# Patient Record
Sex: Female | Born: 1990 | Race: Black or African American | Hispanic: No | Marital: Single | Smoking: Never smoker
Health system: Southern US, Community
[De-identification: ages and names within clinical notes are randomized; demographics above are authoritative.]

## PROBLEM LIST (undated history)

## (undated) DIAGNOSIS — T7840XA Allergy, unspecified, initial encounter: Secondary | ICD-10-CM

## (undated) HISTORY — DX: Allergy, unspecified, initial encounter: T78.40XA

---
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
## 2010-05-14 LAB — URINALYSIS, ROUTINE W REFLEX MICROSCOPIC
Bilirubin Urine: NEGATIVE
Glucose, UA: NEGATIVE mg/dL
Ketones, ur: NEGATIVE mg/dL
Leukocytes, UA: NEGATIVE
Nitrite: NEGATIVE
Protein, ur: NEGATIVE mg/dL
Specific Gravity, Urine: 1.017 (ref 1.005–1.030)
Urobilinogen, UA: 0.2 mg/dL (ref 0.0–1.0)
pH: 5.5 (ref 5.0–8.0)

## 2010-05-14 LAB — URINE MICROSCOPIC-ADD ON

## 2011-06-22 ENCOUNTER — Emergency Department (HOSPITAL_BASED_OUTPATIENT_CLINIC_OR_DEPARTMENT_OTHER)
Admission: EM | Admit: 2011-06-22 | Discharge: 2011-06-22 | Disposition: A | Discharge status: Home / Self Care | Payer: Self-pay | Attending: Emergency Medicine | Admitting: Emergency Medicine

## 2011-06-22 ENCOUNTER — Encounter (HOSPITAL_BASED_OUTPATIENT_CLINIC_OR_DEPARTMENT_OTHER): Payer: Self-pay | Admitting: Emergency Medicine

## 2011-06-22 DIAGNOSIS — K089 Disorder of teeth and supporting structures, unspecified: Secondary | ICD-10-CM | POA: Insufficient documentation

## 2011-06-22 DIAGNOSIS — K0889 Other specified disorders of teeth and supporting structures: Secondary | ICD-10-CM

## 2011-06-22 MED ORDER — PENICILLIN V POTASSIUM 250 MG PO TABS
500.0000 mg | ORAL_TABLET | Freq: Once | ORAL | Status: AC
  Administered 2011-06-22: 500 mg via ORAL
  Filled 2011-06-22: qty 2

## 2011-06-22 MED ORDER — PENICILLIN V POTASSIUM 500 MG PO TABS: 500.0000 mg | ORAL_TABLET | Freq: Three times a day (TID) | ORAL | Status: AC

## 2011-06-22 MED ORDER — HYDROCODONE-ACETAMINOPHEN 5-325 MG PO TABS: 2.0000 | ORAL_TABLET | ORAL | Status: AC | PRN

## 2011-06-22 NOTE — ED Provider Notes (Signed)
History     CSN: [id number]  Arrival date & time 06/22/11  1006   First MD Initiated Contact with Patient 06/22/11 1020      Chief Complaint  Patient presents with  . Dental Pain    (Consider location/radiation/quality/duration/timing/severity/associated sxs/prior treatment) HPI Complains of lower left mouth pain and pain at the upper left central incisor onset approximately 24 hours ago. Pain worse with chewing treated with topical benzocaine with transient relief. Patient recently had dental work done on upper left teeth. Pain is worse with chewing not improved by anything is mild at present History reviewed. No pertinent past medical history. Medical history negative History reviewed. No pertinent past surgical history.  No family history on file.  History  Substance Use Topics  . Smoking status: Never Smoker   . Smokeless tobacco: Not on file  . Alcohol Use: No    OB History    Grav Para Term Preterm Abortions TAB SAB Ect Mult Living                  Review of Systems  Constitutional: Negative.   HENT: Negative.        Dental pain  Respiratory: Negative.   Cardiovascular: Negative.   Gastrointestinal: Negative.   Musculoskeletal: Negative.   Skin: Negative.   Neurological: Negative.   Hematological: Negative.   Psychiatric/Behavioral: Negative.   All other systems reviewed and are negative.    Allergies  Review of patient's allergies indicates no known allergies.  Home Medications  No current outpatient prescriptions on file.  BP 122/78  Pulse 94  Temp(Src) 98.6 F (37 C) (Oral)  Resp 16  Ht 5\' 8"  (1.727 m)  Wt 140 lb (63.504 kg)  BMI 21.29 kg/m2  SpO2 100%  LMP 06/21/2011  Physical Exam  Nursing note and vitals reviewed. Constitutional: She is oriented to person, place, and time. She appears well-developed and well-nourished.  HENT:  Head: Normocephalic.  Right Ear: External ear normal.  Left Ear: External ear normal.  Nose: Nose  normal.  Mouth/Throat: Oropharynx is clear and moist. No oropharyngeal exudate.       No trismus left upper incisor is tender no obvious care he no fluctuance or swelling of gingiva   Eyes: EOM are normal. Pupils are equal, round, and reactive to light.  Neck: Neck supple. No tracheal deviation present.  Cardiovascular: Normal rate.   Pulmonary/Chest: Effort normal.  Abdominal: She exhibits no distension.  Musculoskeletal: Normal range of motion.  Neurological: She is oriented to person, place, and time.  Skin: Skin is warm and dry.  Psychiatric: She has a normal mood and affect.    ED Course  Procedures (including critical care time)  Labs Reviewed - No data to display No results found.   No diagnosis found.    MDM  Plan prescription Norco. Pen-Vee K Patient encouraged keep scheduled appointment with her dentist in 2 days Diagnosis dental pain        Doug Sou, MD 06/22/11 1042

## 2011-06-22 NOTE — ED Notes (Signed)
Pt c/o dental pain (bottom LT) since yesterday- had fillings recently

## 2011-06-22 NOTE — Discharge Instructions (Signed)
Dental Pain Toothache is pain in or around a tooth. It may get worse with chewing or with cold or heat.  HOME CARE  Your dentist may use a numbing medicine during treatment. If so, you may need to avoid eating until the medicine wears off. Ask your dentist about this.   Only take medicine as told by your dentist or doctor.   Avoid chewing food near the painful tooth until after all treatment is done. Ask your dentist about this.  GET HELP RIGHT AWAY IF:   The problem gets worse or new problems appear.   You have a fever.   There is redness and puffiness (swelling) of the face, jaw, or neck.   You cannot open your mouth.   There is pain in the jaw.   There is very bad pain that is not helped by medicine.  MAKE SURE YOU:   Understand these instructions.   Will watch your condition.   Will get help right away if you are not doing well or get worse.  Document Released: 07/31/2007 Document Revised: 01/31/2011 Document Reviewed: 07/31/2007 [facility] Patient Information 2012 [place], [place].  Keep your scheduled appointment with your dentist in 2 days

## 2012-01-22 ENCOUNTER — Emergency Department (HOSPITAL_BASED_OUTPATIENT_CLINIC_OR_DEPARTMENT_OTHER)
Admission: EM | Admit: 2012-01-22 | Discharge: 2012-01-22 | Disposition: A | Discharge status: Home / Self Care | Payer: Medicaid Other | Attending: Emergency Medicine | Admitting: Emergency Medicine

## 2012-01-22 ENCOUNTER — Emergency Department (HOSPITAL_BASED_OUTPATIENT_CLINIC_OR_DEPARTMENT_OTHER): Payer: Medicaid Other

## 2012-01-22 ENCOUNTER — Encounter (HOSPITAL_BASED_OUTPATIENT_CLINIC_OR_DEPARTMENT_OTHER): Payer: Self-pay | Admitting: Emergency Medicine

## 2012-01-22 DIAGNOSIS — S60111A Contusion of right thumb with damage to nail, initial encounter: Secondary | ICD-10-CM

## 2012-01-22 DIAGNOSIS — R209 Unspecified disturbances of skin sensation: Secondary | ICD-10-CM | POA: Insufficient documentation

## 2012-01-22 DIAGNOSIS — S60019A Contusion of unspecified thumb without damage to nail, initial encounter: Secondary | ICD-10-CM

## 2012-01-22 DIAGNOSIS — M7989 Other specified soft tissue disorders: Secondary | ICD-10-CM | POA: Insufficient documentation

## 2012-01-22 DIAGNOSIS — Y9289 Other specified places as the place of occurrence of the external cause: Secondary | ICD-10-CM | POA: Insufficient documentation

## 2012-01-22 DIAGNOSIS — W230XXA Caught, crushed, jammed, or pinched between moving objects, initial encounter: Secondary | ICD-10-CM | POA: Insufficient documentation

## 2012-01-22 DIAGNOSIS — Y9389 Activity, other specified: Secondary | ICD-10-CM | POA: Insufficient documentation

## 2012-01-22 DIAGNOSIS — S6000XA Contusion of unspecified finger without damage to nail, initial encounter: Secondary | ICD-10-CM | POA: Insufficient documentation

## 2012-01-22 MED ORDER — HYDROCODONE-ACETAMINOPHEN 5-500 MG PO TABS: 1.0000 | ORAL_TABLET | Freq: Four times a day (QID) | ORAL | Status: DC | PRN

## 2012-01-22 NOTE — ED Notes (Signed)
Pt c/o Rt thumb pain r/t shutting thumb in car door x 5 days ago.

## 2012-01-22 NOTE — ED Provider Notes (Signed)
History     CSN: [id number]  Arrival date & time 01/22/12  6213   First MD Initiated Contact with Patient 01/22/12 [phone]      Chief Complaint  Patient presents with  . Hand Pain    Rt thumb    (Consider location/radiation/quality/duration/timing/severity/associated sxs/prior treatment) HPI Comments: Patient complains of constant throbbing pain to her right thumb. She states that 5 days ago she shut it in a car door and it's been throbbing since then. She denies any other hand pain. She complains of some numbness to the tip of her finger. Denies any other injuries. She's taking over-the-counter medicines without relief.  Patient is a 21 y.o. female presenting with hand pain.  Hand Pain    History reviewed. No pertinent past medical history.  History reviewed. No pertinent past surgical history.  No family history on file.  History  Substance Use Topics  . Smoking status: Never Smoker   . Smokeless tobacco: Not on file  . Alcohol Use: No    OB History    Grav Para Term Preterm Abortions TAB SAB Ect Mult Living                  Review of Systems  Constitutional: Negative for fever.  Gastrointestinal: Negative for nausea and vomiting.  Musculoskeletal: Positive for joint swelling (to right thumb) and arthralgias.  Skin: Negative for wound.  Neurological: Positive for numbness. Negative for weakness.    Allergies  Review of patient's allergies indicates no known allergies.  Home Medications   Current Outpatient Rx  Name  Route  Sig  Dispense  Refill  . HYDROCODONE-ACETAMINOPHEN 5-500 MG PO TABS   Oral   Take 1-2 tablets by mouth every 6 (six) hours as needed for pain.   15 tablet   0     BP 122/73  Pulse 85  Temp 98.3 F (36.8 C) (Oral)  Resp 16  SpO2 100%  LMP 01/13/2012  Physical Exam  Constitutional: She appears well-developed and well-nourished.  HENT:  Head: Normocephalic and atraumatic.  Cardiovascular: Normal rate.   Pulmonary/Chest:  Effort normal and breath sounds normal.  Musculoskeletal:       Patient has mild swelling over the IP joint and the distal phalanx of the right thumb. She appears to have a subungual hematoma to the right, however she has gel nails in place slight take completely evaluate and nail. The nail does appear intact with no lacerations. She has pain on palpation over the IP joint in the distal phalanx as well as pain on range of motion of the distal phalanx. She has some subjective numbness to light touch distally, capillary refill is less than 2. She is able to flex and extend the joint however is limited due to pain. There's no other pain to the hand.    ED Course  Procedures (including critical care time)   Dg Finger Thumb Right  01/22/2012  *RADIOLOGY REPORT*  Clinical Data: Right thumb injury  RIGHT THUMB 2+V  Comparison: None.  Findings: Three views of the right thumb submitted.  No acute fracture or subluxation.  No radiopaque foreign body.  IMPRESSION: No acute fracture or subluxation.   Original Report Authenticated By: Natasha Mead, M.D.       1. Thumb contusion   2. Subungual hematoma of right thumb       MDM  No fractures identified. Patient does appear to have a subungual hematoma however cannot be fully evaluated due to the gel nails  that she has been placed. However she did not have significant pain over that and nail bed. Majority of her pain is over the IP joint. Patient placed in a finger splint advised ice and elevation and was given prescription for Vicodin for pain I did advise her that is a possibility that her nail may fall off. Advise her to followup with her primary care physician return here as needed for any worsening symptoms.        Rolan Bucco, MD 01/22/12 [phone]

## 2012-01-22 NOTE — ED Notes (Signed)
Patient ambulatory to radiology.

## 2014-01-08 IMAGING — CR DG FINGER THUMB 2+V*R*
3 series · 3 of 3 positions shown · non-contrast
Comparison: None.

CLINICAL DATA: Right thumb injury

RIGHT THUMB 2+V

[x finger pa right]
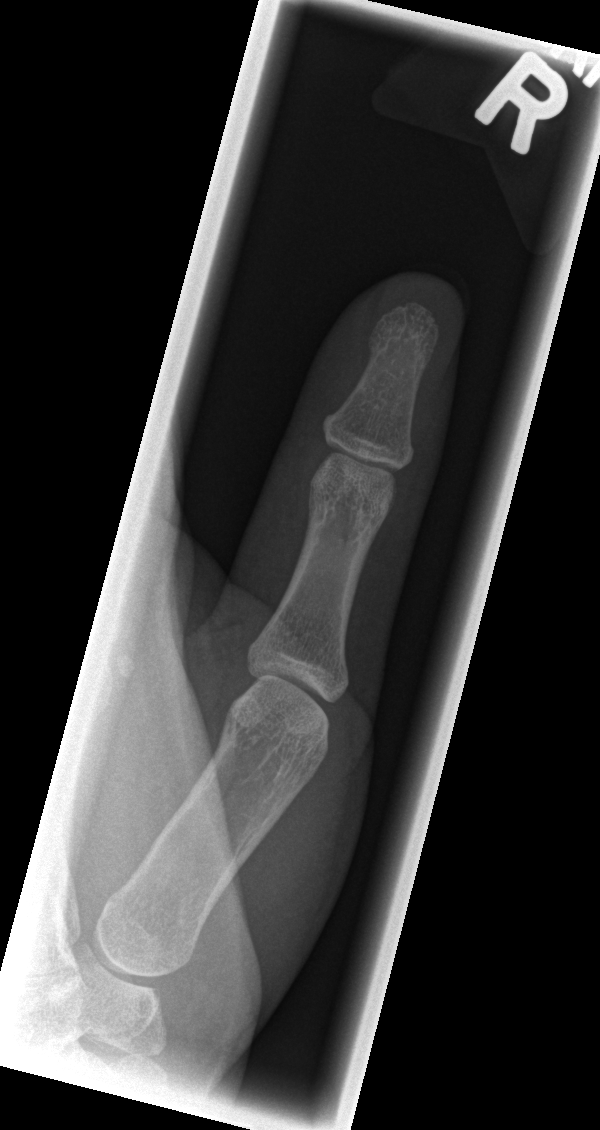

[x finger obl. right]
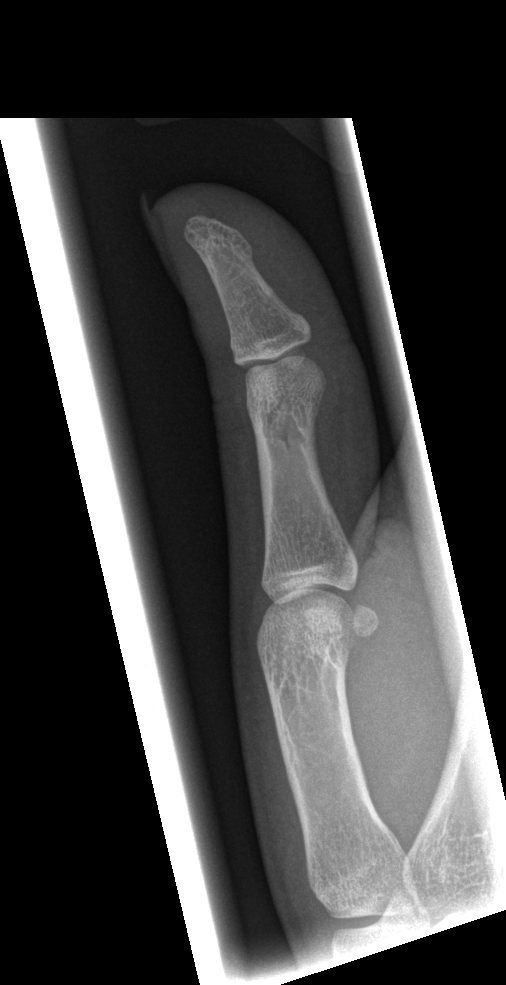

[x finger lateral right]
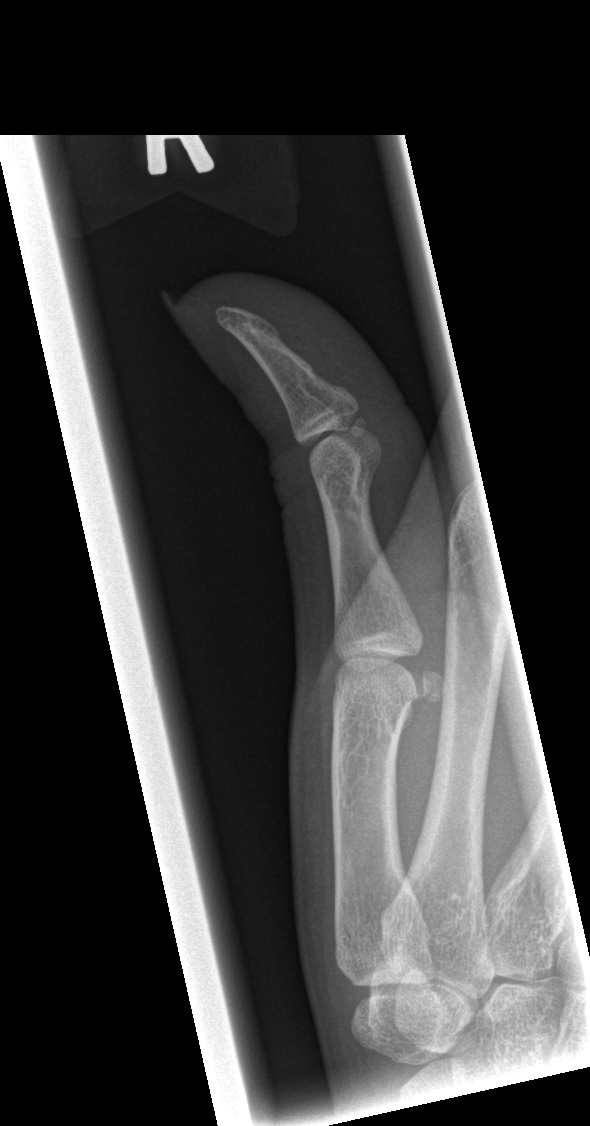

[3 of 3 positions shown; findings below may reference images not displayed]

FINDINGS: Three views of the right thumb submitted.  No acute
fracture or subluxation.  No radiopaque foreign body.
IMPRESSION: No acute fracture or subluxation.

## 2014-05-29 ENCOUNTER — Emergency Department (HOSPITAL_BASED_OUTPATIENT_CLINIC_OR_DEPARTMENT_OTHER)
Admission: EM | Admit: 2014-05-29 | Discharge: 2014-05-29 | Disposition: A | Discharge status: Home / Self Care | Payer: Medicaid Other | Attending: Emergency Medicine | Admitting: Emergency Medicine

## 2014-05-29 ENCOUNTER — Encounter (HOSPITAL_BASED_OUTPATIENT_CLINIC_OR_DEPARTMENT_OTHER): Payer: Self-pay | Admitting: *Deleted

## 2014-05-29 DIAGNOSIS — Y9389 Activity, other specified: Secondary | ICD-10-CM | POA: Insufficient documentation

## 2014-05-29 DIAGNOSIS — Z23 Encounter for immunization: Secondary | ICD-10-CM | POA: Insufficient documentation

## 2014-05-29 DIAGNOSIS — W25XXXA Contact with sharp glass, initial encounter: Secondary | ICD-10-CM | POA: Insufficient documentation

## 2014-05-29 DIAGNOSIS — Y9289 Other specified places as the place of occurrence of the external cause: Secondary | ICD-10-CM | POA: Insufficient documentation

## 2014-05-29 DIAGNOSIS — S31010A Laceration without foreign body of lower back and pelvis without penetration into retroperitoneum, initial encounter: Secondary | ICD-10-CM | POA: Insufficient documentation

## 2014-05-29 DIAGNOSIS — Y998 Other external cause status: Secondary | ICD-10-CM | POA: Insufficient documentation

## 2014-05-29 DIAGNOSIS — IMO0002 Reserved for concepts with insufficient information to code with codable children: Secondary | ICD-10-CM

## 2014-05-29 MED ORDER — TETANUS-DIPHTH-ACELL PERTUSSIS 5-2.5-18.5 LF-MCG/0.5 IM SUSP
INTRAMUSCULAR | Status: AC
  Administered 2014-05-29: 0.5 mL via INTRAMUSCULAR
  Filled 2014-05-29: qty 0.5

## 2014-05-29 MED ORDER — HYDROCODONE-ACETAMINOPHEN 5-325 MG PO TABS: 1.0000 | ORAL_TABLET | Freq: Four times a day (QID) | ORAL | Status: DC | PRN

## 2014-05-29 MED ORDER — TETANUS-DIPHTH-ACELL PERTUSSIS 5-2.5-18.5 LF-MCG/0.5 IM SUSP
0.5000 mL | Freq: Once | INTRAMUSCULAR | Status: AC
  Administered 2014-05-29: 0.5 mL via INTRAMUSCULAR

## 2014-05-29 MED ORDER — LIDOCAINE-EPINEPHRINE 2 %-1:100000 IJ SOLN
INTRAMUSCULAR | Status: AC
  Administered 2014-05-29: 02:00:00
  Filled 2014-05-29: qty 1

## 2014-05-29 NOTE — ED Notes (Addendum)
[facility] Pt tolerating suturing. NAD, calm, no changes.

## 2014-05-29 NOTE — Discharge Instructions (Signed)

## 2014-05-29 NOTE — ED Notes (Addendum)
Pt states that she was sitting on a glass table about 30 minutes ago when the glass gave away causing her to fall into the glass table. Pt has a horseshoe shaped laceration noted to right lower back. Bleeding controlled at present. Wound cleaned by EMT on arrival. Denies any other injury.

## 2014-05-29 NOTE — ED Provider Notes (Signed)
CSN: [id number]     Arrival date & time 05/29/14  0156 History   First MD Initiated Contact with Patient 05/29/14 0200     Chief Complaint  Patient presents with  . Body Laceration     (Consider location/radiation/quality/duration/timing/severity/associated sxs/prior Treatment) HPI  This is a 24 year old female who presents with a laceration to the right flank and back. Patient reports that she was sitting on a glass table when it broke and she fell. A piece of glass cut her. She denies any other injury. She denies hitting her head or losing consciousness. Last tetanus was when she was 65 or 24 years old. This happened approximately 30 minutes prior to arrival.  History reviewed. No pertinent past medical history. History reviewed. No pertinent past surgical history. No family history on file. History  Substance Use Topics  . Smoking status: Never Smoker   . Smokeless tobacco: Not on file  . Alcohol Use: No   OB History    No data available     Review of Systems  Skin: Positive for wound. Negative for color change.  All other systems reviewed and are negative.     Allergies  Review of patient's allergies indicates no known allergies.  Home Medications   Prior to Admission medications   Medication Sig Start Date End Date Taking? Authorizing Provider  HYDROcodone-acetaminophen (NORCO/VICODIN) 5-325 MG per tablet Take 1 tablet by mouth every 6 (six) hours as needed. 05/29/14   Shon Baton, MD   LMP 05/23/2014 (Approximate) Physical Exam  Constitutional: She is oriented to person, place, and time. She appears well-developed and well-nourished. No distress.  HENT:  Head: Normocephalic and atraumatic.  Cardiovascular: Normal rate, regular rhythm and normal heart sounds.   No murmur heard. Pulmonary/Chest: Effort normal and breath sounds normal. No respiratory distress. She has no wheezes.  Neurological: She is alert and oriented to person, place, and time.  Skin: Skin  is warm and dry.  4 cm laceration, U-shaped over the right lower back, superficial but gaping, bleeding controlled, no foreign bodies palpated  Psychiatric: She has a normal mood and affect.  Nursing note and vitals reviewed.   ED Course  Procedures (including critical care time)  LACERATION REPAIR Performed by: Shon Baton Authorized by: Shon Baton Consent: Verbal consent obtained. Risks and benefits: risks, benefits and alternatives were discussed Consent given by: patient Patient identity confirmed: provided demographic data Prepped and Draped in normal sterile fashion Wound explored  Laceration Location:back  Laceration Length: 4cm  No Foreign Bodies seen or palpated  Anesthesia: local infiltration  Local anesthetic: lidocaine 1% w epinephrine  Anesthetic total: 3 ml  Irrigation method: syringe Amount of cleaning: standard  Skin closure: 3-0 nylon  Number of sutures: 7  Technique: interrupted and mattress suture  Patient tolerance: Patient tolerated the procedure well with no immediate complications.  Labs Review Labs Reviewed - No data to display  Imaging Review No results found.   EKG Interpretation None      MDM   Final diagnoses:  Laceration    Patient presents with laceration to the right back. Denies any other injury. No other notable injury on exam. Laceration is superficial but gaping over the right back. Tetanus updated. Wound closed as above.  Patient tolerated the procedure well. Discussed with patient wound care. Patient to follow-up in 10 days for suture removal.  After history, exam, and medical workup I feel the patient has been appropriately medically screened and is safe for discharge  home. Pertinent diagnoses were discussed with the patient. Patient was given return precautions.    Shon Batonourtney F Horton, MD 05/29/14 [phone]

## 2014-05-29 NOTE — ED Notes (Signed)
[facility]. Preparing local and suturing. Pt alert, NAD, calm, interactive, resps e/u, speaking in clear complete sentences, no dyspnea noted, bleeding controlled. Sitting upright on edge of bed.

## 2014-06-08 ENCOUNTER — Emergency Department (HOSPITAL_BASED_OUTPATIENT_CLINIC_OR_DEPARTMENT_OTHER)
Admission: EM | Admit: 2014-06-08 | Discharge: 2014-06-08 | Disposition: A | Discharge status: Home / Self Care | Payer: Medicaid Other | Attending: Emergency Medicine | Admitting: Emergency Medicine

## 2014-06-08 ENCOUNTER — Encounter (HOSPITAL_BASED_OUTPATIENT_CLINIC_OR_DEPARTMENT_OTHER): Payer: Self-pay

## 2014-06-08 DIAGNOSIS — Z4802 Encounter for removal of sutures: Secondary | ICD-10-CM | POA: Insufficient documentation

## 2014-06-08 NOTE — Discharge Instructions (Signed)
Return to the emergency room with worsening of symptoms, new symptoms or with symptoms that are concerning, especially fevers, redness, swelling, pus.  Suture Removal, Care After Refer to this sheet in the next few weeks. These instructions provide you with information on caring for yourself after your procedure. Your health care provider may also give you more specific instructions. Your treatment has been planned according to current medical practices, but problems sometimes occur. Call your health care provider if you have any problems or questions after your procedure. WHAT TO EXPECT AFTER THE PROCEDURE After your stitches (sutures) are removed, it is typical to have the following:  Some discomfort and swelling in the wound area.  Slight redness in the area. HOME CARE INSTRUCTIONS   If you have skin adhesive strips over the wound area, do not take the strips off. They will fall off on their own in a few days. If the strips remain in place after 14 days, you may remove them.  Change any bandages (dressings) at least once a day or as directed by your health care provider. If the bandage sticks, soak it off with warm, soapy water.  Apply cream or ointment only as directed by your health care provider. If using cream or ointment, wash the area with soap and water 2 times a day to remove all the cream or ointment. Rinse off the soap and pat the area dry with a clean towel.  Keep the wound area dry and clean. If the bandage becomes wet or dirty, or if it develops a bad smell, change it as soon as possible.  Continue to protect the wound from injury.  Use sunscreen when out in the sun. New scars become sunburned easily. SEEK MEDICAL CARE IF:  You have increasing redness, swelling, or pain in the wound.  You see pus coming from the wound.  You have a fever.  You notice a bad smell coming from the wound or dressing.  Your wound breaks open (edges not staying together). Document Released:  11/06/2000 Document Revised: 12/02/2012 Document Reviewed: 09/23/2012 [facility] Patient Information 2015 [place], [place]. This information is not intended to replace advice given to you by your health care provider. Make sure you discuss any questions you have with your health care provider.  Scar Minimization You will have a scar anytime you have surgery and a cut is made in the skin or you have something removed from your skin (mole, skin cancer, cyst). Although scars are unavoidable following surgery, there are ways to minimize their appearance. It is important to follow all the instructions you receive from your caregiver about wound care. How your wound heals will influence the appearance of your scar. If you do not follow the wound care instructions as directed, complications such as infection may occur. Wound instructions include keeping the wound clean, moist, and not letting the wound form a scab. Some people form scars that are raised and lumpy (hypertrophic) or larger than the initial wound (keloidal). HOME CARE INSTRUCTIONS   Follow wound care instructions as directed.  Keep the wound clean by washing it with soap and water.  Keep the wound moist with provided antibiotic cream or petroleum jelly until completely healed. Moisten twice a day for about 2 weeks.  Get stitches (sutures) taken out at the scheduled time.  Avoid touching or manipulating your wound unless needed. Wash your hands thoroughly before and after touching your wound.  Follow all restrictions such as limits on exercise or work. This depends on  where your scar is located.  Keep the scar protected from sunburn. Cover the scar with sunscreen/sunblock with SPF 30 or higher.  Gently massage the scar using a circular motion to help minimize the appearance of the scar. Do this only after the wound has closed and all the sutures have been removed.  For hypertrophic or keloidal scars, there are several ways to treat and  minimize their appearance. Methods include compression therapy, intralesional corticosteroids, laser therapy, or surgery. These methods are performed by your caregiver. Remember that the scar may appear lighter or darker than your normal skin color. This difference in color should even out with time. SEEK MEDICAL CARE IF:   You have a fever.  You develop signs of infection such as pain, redness, pus, and warmth.  You have questions or concerns. Document Released: 08/01/2009 Document Revised: 05/06/2011 Document Reviewed: 08/01/2009 [facility] Patient Information 2015 [place]. This information is not intended to replace advice given to you by your health care provider. Make sure you discuss any questions you have with your health care provider.

## 2014-06-08 NOTE — ED Provider Notes (Signed)
CSN: [id number]     Arrival date & time 06/08/14  1329 History   First MD Initiated Contact with Patient 06/08/14 1346     Chief Complaint  Patient presents with  . Suture / Staple Removal     (Consider location/radiation/quality/duration/timing/severity/associated sxs/prior Treatment) HPI  Ulice BrilliantFatima Nadel is a 24 y.o. female presenting with suture removal. Patient with laceration to right flank 10 days ago. Patient denies any fevers, chills, nausea, vomiting. No pus.   History reviewed. No pertinent past medical history. No past surgical history on file. No family history on file. History  Substance Use Topics  . Smoking status: Never Smoker   . Smokeless tobacco: Not on file  . Alcohol Use: No   OB History    No data available     Review of Systems  Constitutional: Negative for fever and chills.  Gastrointestinal: Negative for nausea and vomiting.  Skin: Positive for wound. Negative for color change.      Allergies  Review of patient's allergies indicates no known allergies.  Home Medications   Prior to Admission medications   Medication Sig Start Date End Date Taking? Authorizing Provider  HYDROcodone-acetaminophen (NORCO/VICODIN) 5-325 MG per tablet Take 1 tablet by mouth every 6 (six) hours as needed. 05/29/14   Shon Batonourtney F Horton, MD   BP 115/75 mmHg  Pulse 94  Temp(Src) 98.8 F (37.1 C) (Oral)  Resp 16  Ht 5\' 8"  (1.727 m)  Wt 160 lb (72.576 kg)  BMI 24.33 kg/m2  SpO2 100%  LMP 05/23/2014 (Approximate) Physical Exam  Constitutional: She appears well-developed and well-nourished. No distress.  HENT:  Head: Normocephalic and atraumatic.  Eyes: Conjunctivae are normal. Right eye exhibits no discharge. Left eye exhibits no discharge.  Pulmonary/Chest: Effort normal. No respiratory distress.  Neurological: She is alert. Coordination normal.  Skin: She is not diaphoretic.  Clean dry intact laceration with 7 sutures U shape in right lower flank.  Psychiatric:  She has a normal mood and affect. Her behavior is normal.  Nursing note and vitals reviewed.   ED Course  Procedures (including critical care time) Labs Review Labs Reviewed - No data to display  Imaging Review No results found.   EKG Interpretation None      SUTURE REMOVAL Performed by: Oswaldo ConroyREECH, Abreanna Drawdy  Consent: Verbal consent obtained. Patient identity confirmed: provided demographic data Time out: Immediately prior to procedure a "time out" was called to verify the correct patient, procedure, equipment, support staff and site/side marked as required.  Location details: left lower back  Wound Appearance: clean  Sutures/Staples Removed: 7  Facility: sutures placed in this facility Patient tolerance: Patient tolerated the procedure well with no immediate complications.     MDM   Final diagnoses:  Visit for suture removal    Pt to ER for staple/suture removal and wound check. Procedure tolerated well. Vitals normal, no signs of infection. Scar minimization & return precautions given at dc.    Oswaldo ConroyVictoria Zoie Sarin, PA-C 06/08/14 1418  Glynn OctaveStephen Rancour, MD 06/08/14 [phone]

## 2014-06-08 NOTE — ED Notes (Signed)
For suture removal

## 2016-12-05 ENCOUNTER — Emergency Department (HOSPITAL_BASED_OUTPATIENT_CLINIC_OR_DEPARTMENT_OTHER): Payer: Self-pay

## 2016-12-05 ENCOUNTER — Encounter (HOSPITAL_BASED_OUTPATIENT_CLINIC_OR_DEPARTMENT_OTHER): Payer: Self-pay

## 2016-12-05 ENCOUNTER — Emergency Department (HOSPITAL_BASED_OUTPATIENT_CLINIC_OR_DEPARTMENT_OTHER)
Admission: EM | Admit: 2016-12-05 | Discharge: 2016-12-05 | Disposition: A | Discharge status: Home / Self Care | Payer: Self-pay | Attending: Emergency Medicine | Admitting: Emergency Medicine

## 2016-12-05 DIAGNOSIS — R05 Cough: Secondary | ICD-10-CM | POA: Insufficient documentation

## 2016-12-05 DIAGNOSIS — T7840XA Allergy, unspecified, initial encounter: Secondary | ICD-10-CM | POA: Insufficient documentation

## 2016-12-05 DIAGNOSIS — R059 Cough, unspecified: Secondary | ICD-10-CM

## 2016-12-05 MED ORDER — PREDNISONE 50 MG PO TABS
60.0000 mg | ORAL_TABLET | Freq: Once | ORAL | Status: AC
  Administered 2016-12-05: 60 mg via ORAL
  Filled 2016-12-05: qty 1

## 2016-12-05 MED ORDER — LORATADINE 10 MG PO TABS
10.0000 mg | ORAL_TABLET | Freq: Once | ORAL | Status: AC
  Administered 2016-12-05: 10 mg via ORAL
  Filled 2016-12-05: qty 1

## 2016-12-05 MED ORDER — ALBUTEROL SULFATE HFA 108 (90 BASE) MCG/ACT IN AERS
2.0000 | INHALATION_SPRAY | Freq: Once | RESPIRATORY_TRACT | Status: AC
  Administered 2016-12-05: 2 via RESPIRATORY_TRACT

## 2016-12-05 MED ORDER — AEROCHAMBER PLUS FLO-VU MEDIUM MISC
1.0000 | Freq: Once | Status: AC
  Administered 2016-12-05: 1
  Filled 2016-12-05: qty 1

## 2016-12-05 MED ORDER — PREDNISONE 20 MG PO TABS: ORAL_TABLET | ORAL | 0 refills | Status: DC

## 2016-12-05 MED ORDER — LORATADINE 10 MG PO TABS: 10.0000 mg | ORAL_TABLET | Freq: Every day | ORAL | 0 refills | Status: DC

## 2016-12-05 NOTE — ED Notes (Signed)
ED Provider at bedside. 

## 2016-12-05 NOTE — ED Provider Notes (Signed)
[facility]   CSN: [id number] Arrival date & time: 12/05/16  0204     History   Chief Complaint Chief Complaint  Patient presents with  . Cough    HPI Julia Bridges is a 26 y.o. female.  The history is provided by the patient.  Cough  This is a new problem. The current episode started more than 2 days ago. The problem occurs every few minutes. The problem has not changed since onset.The cough is non-productive. There has been no fever. Pertinent negatives include no chest pain, no chills, no sweats, no weight loss, no ear congestion, no ear pain, no rhinorrhea, no sore throat, no shortness of breath, no wheezing and no eye redness. Treatments tried: robitussin. The treatment provided no relief. She is not a smoker. Her past medical history does not include pneumonia, bronchiectasis, COPD, emphysema or asthma.    History reviewed. No pertinent past medical history.  There are no active problems to display for this patient.   History reviewed. No pertinent surgical history.  OB History    No data available       Home Medications    Prior to Admission medications   Medication Sig Start Date End Date Taking? Authorizing Provider  HYDROcodone-acetaminophen (NORCO/VICODIN) 5-325 MG per tablet Take 1 tablet by mouth every 6 (six) hours as needed. 05/29/14  Yes Horton, Mayer Masker, MD    Family History No family history on file.  Social History Social History  Substance Use Topics  . Smoking status: Never Smoker  . Smokeless tobacco: Never Used  . Alcohol use No     Allergies   Patient has no known allergies.   Review of Systems Review of Systems  Constitutional: Negative for chills, fever and weight loss.  HENT: Negative for ear pain, rhinorrhea and sore throat.   Eyes: Negative for redness.  Respiratory: Positive for cough. Negative for shortness of breath and wheezing.   Cardiovascular: Negative for chest pain.  All other systems  reviewed and are negative.    Physical Exam Updated Vital Signs BP (!) 134/94 (BP Location: Right Arm)   Pulse (!) 103   Temp 98.6 F (37 C) (Oral)   Resp 20   Ht  (1.727 m)   Wt 77.1 kg (170 lb)   LMP 11/20/2016   SpO2 100%   BMI 25.85 kg/m   Physical Exam  Constitutional: She is oriented to person, place, and time. She appears well-developed and well-nourished. No distress.  HENT:  Head: Normocephalic and atraumatic.  Mouth/Throat: No oropharyngeal exudate.  Eyes: Pupils are equal, round, and reactive to light. Conjunctivae are normal.  Neck: Normal range of motion. Neck supple.  Cardiovascular: Normal rate, regular rhythm, normal heart sounds and intact distal pulses.   Pulmonary/Chest: Effort normal and breath sounds normal. No stridor. No respiratory distress. She has no wheezes. She has no rales. She exhibits no tenderness.  Abdominal: Soft. Bowel sounds are normal. She exhibits no mass. There is no tenderness. There is no rebound and no guarding.  Musculoskeletal: Normal range of motion. She exhibits no tenderness.  Negative Homan's sign  Neurological: She is alert and oriented to person, place, and time. She displays normal reflexes.  Skin: Skin is warm and dry. Capillary refill takes less than 2 seconds.  Psychiatric: She has a normal mood and affect.     ED Treatments / Results   Vitals:   12/05/16 0227 12/05/16 0251  BP:    Pulse:  Resp:    Temp:    SpO2: 100% 100%    Radiology Dg Chest 2 View  Result Date: 12/05/2016 CLINICAL DATA:  Nonproductive cough for 1 week. EXAM: CHEST  2 VIEW COMPARISON:  06/08/2004 FINDINGS: The lungs are clear. The pulmonary vasculature is normal. Heart size is normal. Hilar and mediastinal contours are unremarkable. There is no pleural effusion. IMPRESSION: No active cardiopulmonary disease. Electronically Signed   By: Ellery Plunk M.D.   On: 12/05/2016 03:39    Procedures Procedures (including critical care  time)  Medications Ordered in ED Medications  predniSONE (DELTASONE) tablet 60 mg (60 mg Oral Given 12/05/16 0245)  albuterol (PROVENTIL HFA;VENTOLIN HFA) 108 (90 Base) MCG/ACT inhaler 2 puff (2 puffs Inhalation Given 12/05/16 0247)  AEROCHAMBER PLUS FLO-VU MEDIUM MISC 1 each (1 each Other Given 12/05/16 0247)  loratadine (CLARITIN) tablet 10 mg (10 mg Oral Given 12/05/16 0245)     PERC negative wells 0 highly doubt PE in this low risk patient.  No leg pain no surgery no travel no OCP  Final Clinical Impressions(s) / ED Diagnoses  Likely allergic in nature will treat for same.  Strict return precautions.Strict return precautions given for  Shortness of breath, swelling or the lips or tongue, chest pain, dyspnea on exertion, new weakness or numbness changes in vision or speech,  Inability to tolerate liquids or food, changes in voice cough, altered mental status or any concerns. No signs of systemic illness or infection. The patient is nontoxic-appearing on exam and vital signs are within normal limits.    I have reviewed the triage vital signs and the nursing notes. Pertinent labs &imaging results that were available during my care of the patient were reviewed by me and considered in my medical decision making (see chart for details).  After history, exam, and medical workup I feel the patient has been appropriately medically screened and is safe for discharge home. Pertinent diagnoses were discussed with the patient. Patient was given return precautions.    Rusty Glodowski, MD 12/05/16 [phone]

## 2016-12-05 NOTE — ED Triage Notes (Signed)
Pt c/o dry cough x 1 week with SOB. Has taken OTC robitussin and dayquill with no relief. Pt denies fevers.

## 2016-12-05 NOTE — ED Notes (Signed)
Patient transported to X-ray 

## 2018-11-22 IMAGING — DX DG CHEST 2V
3 series · 3 of 3 positions shown · non-contrast
Comparison: 06/08/2004

CLINICAL DATA: Nonproductive cough for 1 week.

EXAM:
CHEST  2 VIEW

[chest pa]
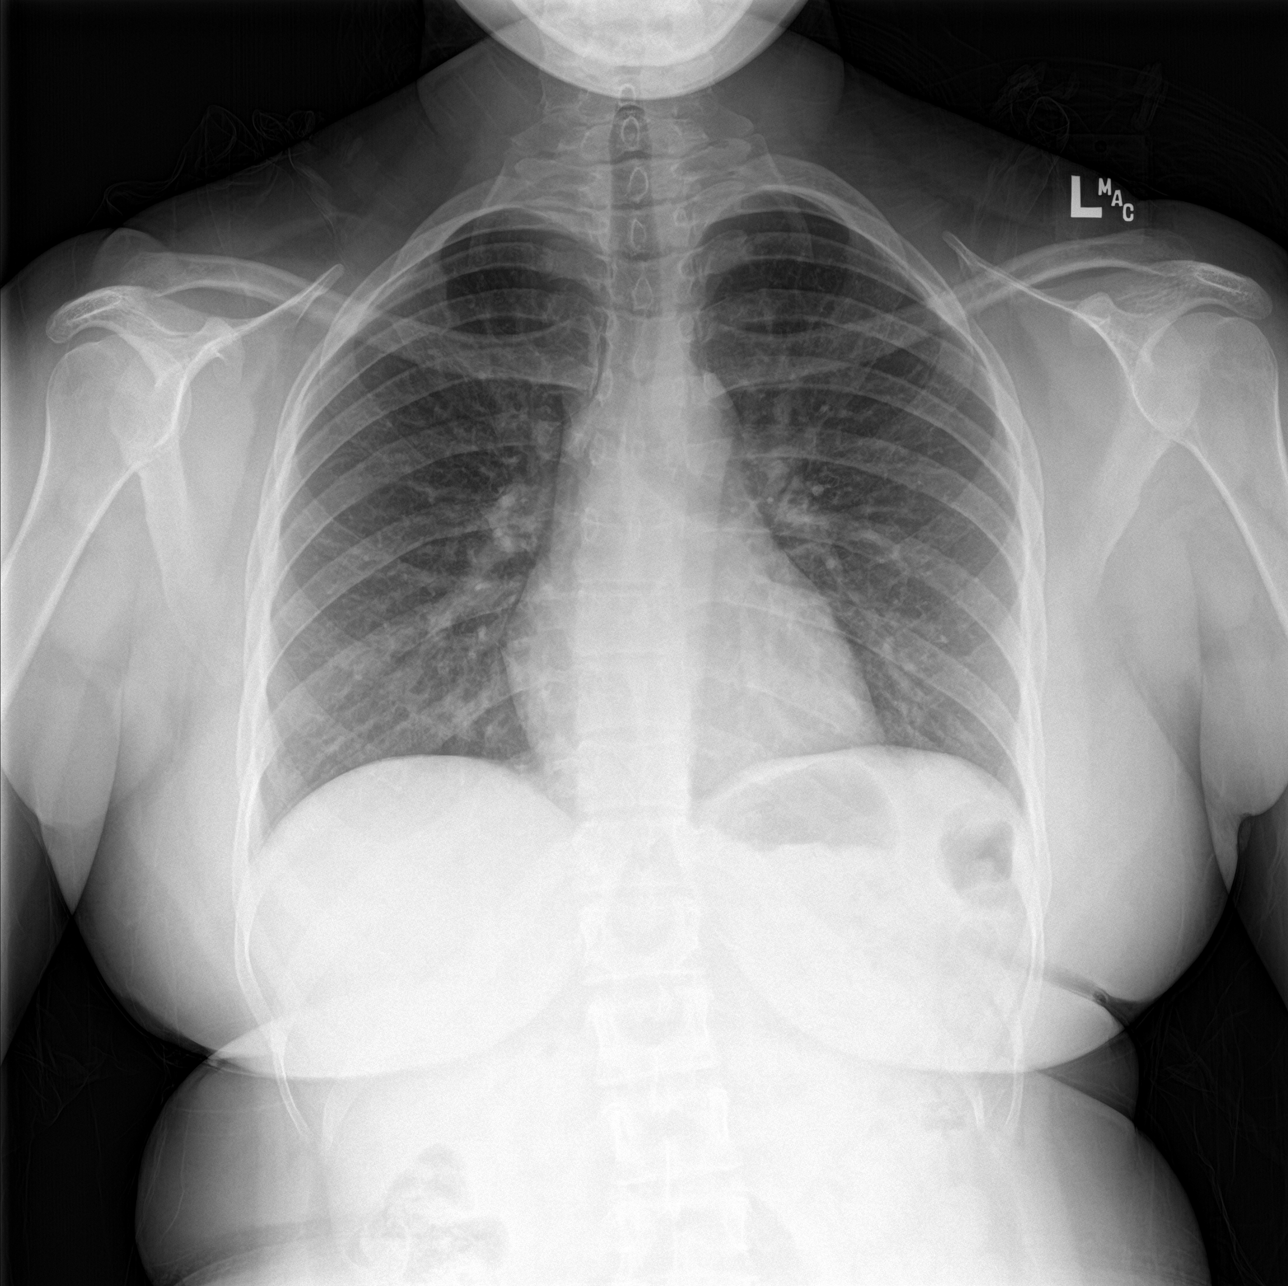

[chest lat (1 of 2)]
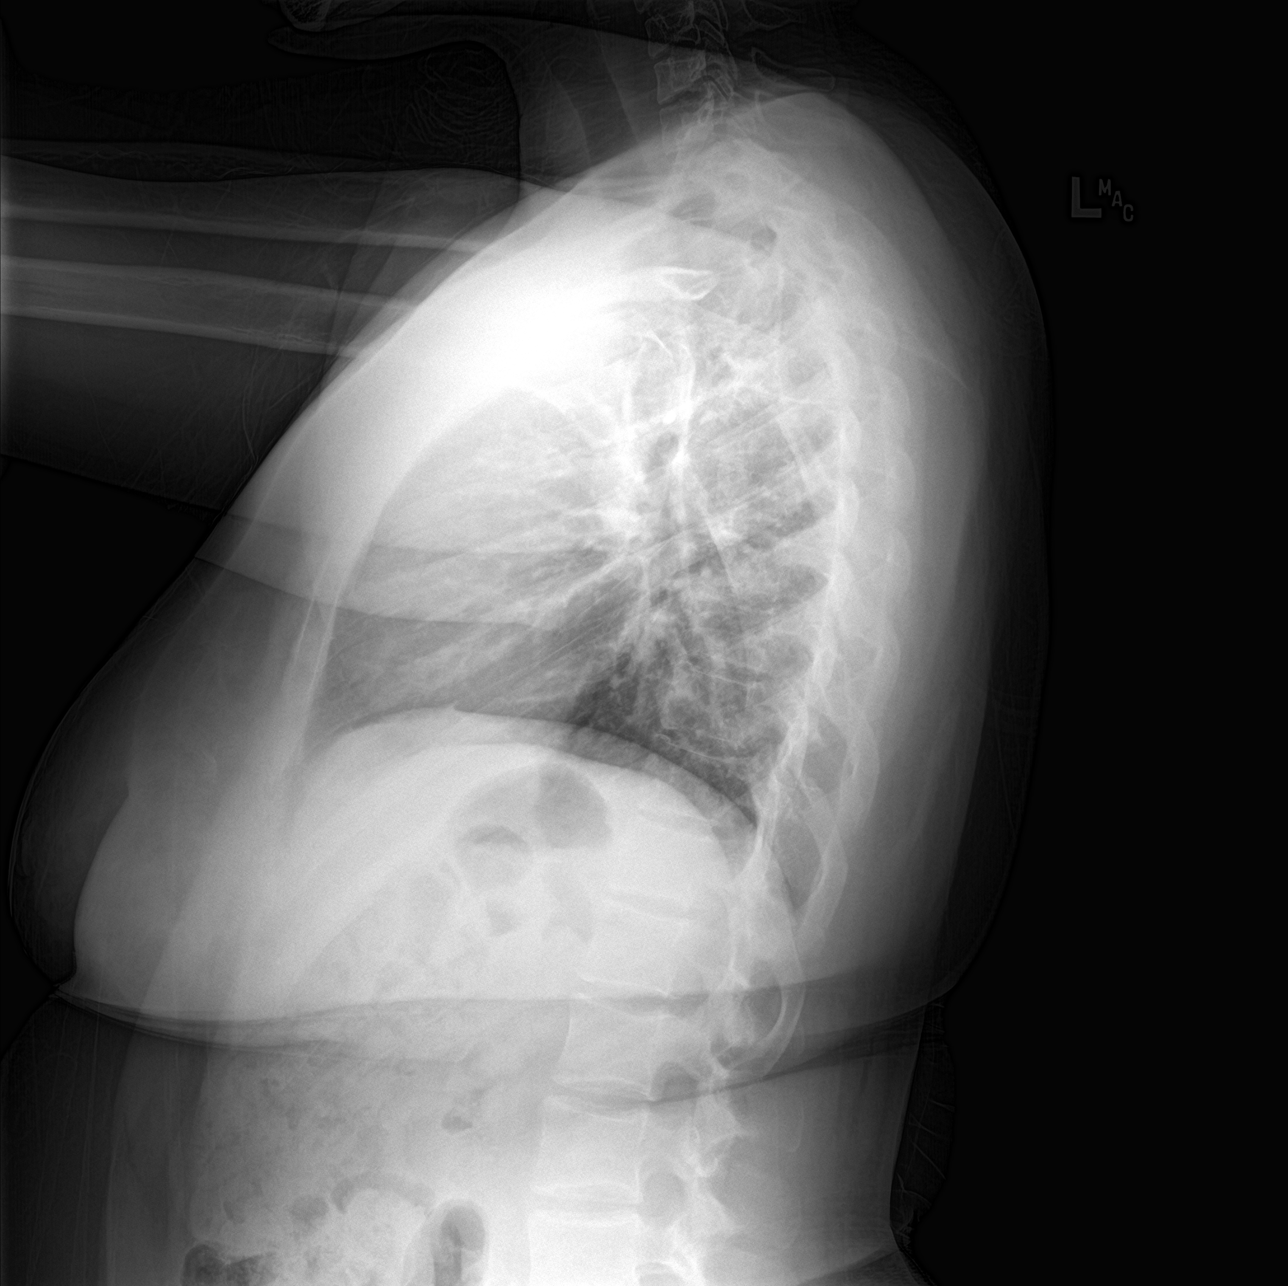

[chest lat (2 of 2)]
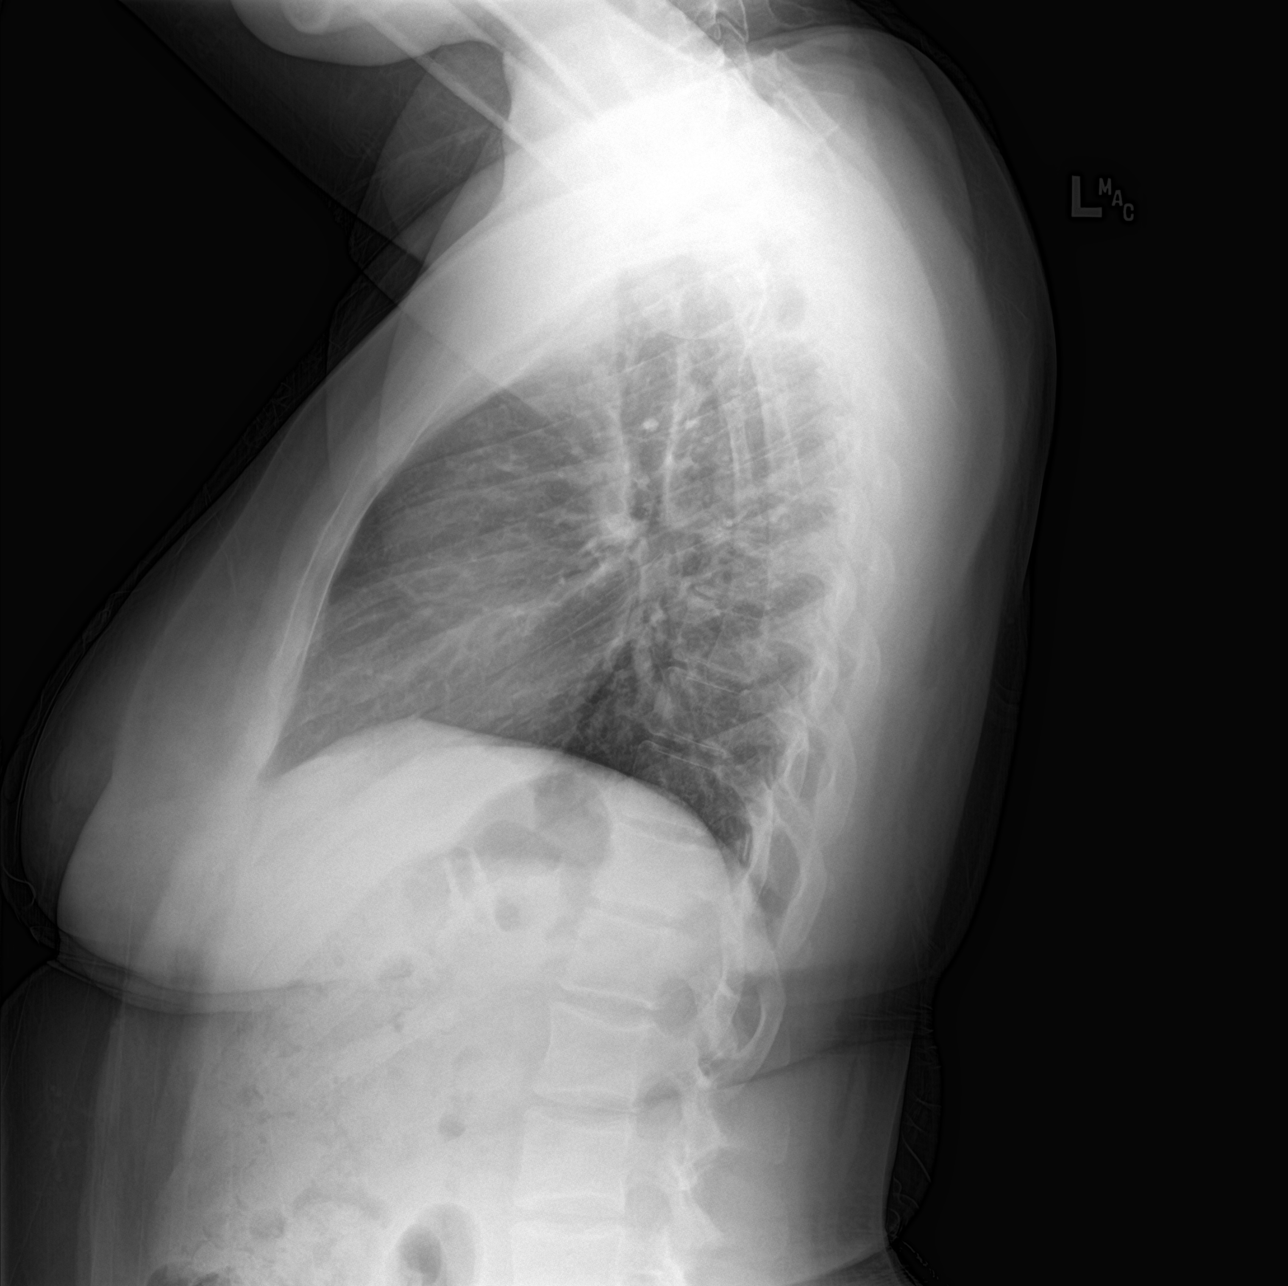

[3 of 3 positions shown; findings below may reference images not displayed]

FINDINGS: The lungs are clear. The pulmonary vasculature is normal. Heart size
is normal. Hilar and mediastinal contours are unremarkable. There is
no pleural effusion.
IMPRESSION: No active cardiopulmonary disease.

## 2018-12-07 ENCOUNTER — Other Ambulatory Visit: Payer: Self-pay

## 2018-12-07 DIAGNOSIS — Z20822 Contact with and (suspected) exposure to covid-19: Secondary | ICD-10-CM

## 2018-12-08 ENCOUNTER — Telehealth: Payer: Self-pay | Admitting: Hematology

## 2018-12-08 LAB — NOVEL CORONAVIRUS, NAA: SARS-CoV-2, NAA: NOT DETECTED

## 2018-12-08 NOTE — Telephone Encounter (Signed)
Pt is aware covid 19 test is negative °

## 2019-12-16 ENCOUNTER — Ambulatory Visit (HOSPITAL_COMMUNITY)
Admission: EM | Admit: 2019-12-16 | Discharge: 2019-12-16 | Disposition: A | Discharge status: Home / Self Care | Payer: Self-pay | Attending: Family Medicine | Admitting: Family Medicine

## 2019-12-16 ENCOUNTER — Other Ambulatory Visit: Payer: Self-pay

## 2019-12-16 ENCOUNTER — Encounter (HOSPITAL_COMMUNITY): Payer: Self-pay

## 2019-12-16 DIAGNOSIS — H66002 Acute suppurative otitis media without spontaneous rupture of ear drum, left ear: Secondary | ICD-10-CM

## 2019-12-16 MED ORDER — FLUCONAZOLE 150 MG PO TABS: 150.0000 mg | ORAL_TABLET | Freq: Once | ORAL | 0 refills | Status: AC

## 2019-12-16 MED ORDER — AMOXICILLIN-POT CLAVULANATE 875-125 MG PO TABS: 1.0000 | ORAL_TABLET | Freq: Two times a day (BID) | ORAL | 0 refills | Status: AC

## 2019-12-16 NOTE — ED Triage Notes (Signed)
Pt present left ear pain, symptom started yesterday evening. Pt denies any drainage from the ear.

## 2019-12-16 NOTE — ED Provider Notes (Signed)
[facility]    CSN: [id number] Arrival date & time: 12/16/19  1714      History   Chief Complaint Chief Complaint  Patient presents with   Otalgia    left ear pain    HPI Julia Bridges is a 29 y.o. female presenting today for evaluation of left ear pain.  Patient reports that beginning yesterday she began to develop pain in her left ear.  Denies any drainage or change in hearing.  Has had a mild associated sore throat, but denies significant rhinorrhea or congestion or cough.  Denies fevers.  HPI  History reviewed. No pertinent past medical history.  There are no problems to display for this patient.   History reviewed. No pertinent surgical history.  OB History   No obstetric history on file.      Home Medications    Prior to Admission medications   Medication Sig Start Date End Date Taking? Authorizing Provider  amoxicillin-clavulanate (AUGMENTIN) 875-125 MG tablet Take 1 tablet by mouth every 12 (twelve) hours for 7 days. 12/16/19 12/23/19  Stella Encarnacion C, PA-C  fluconazole (DIFLUCAN) 150 MG tablet Take 1 tablet (150 mg total) by mouth once for 1 dose. 12/16/19 12/16/19  Aariah Godette C, PA-C  HYDROcodone-acetaminophen (NORCO/VICODIN) 5-325 MG per tablet Take 1 tablet by mouth every 6 (six) hours as needed. 05/29/14   Horton, Mayer Masker, MD  loratadine (CLARITIN) 10 MG tablet Take 1 tablet (10 mg total) by mouth daily. 12/05/16   Palumbo, April, MD  predniSONE (DELTASONE) 20 MG tablet 3 tabs po day one, then 2 po daily x 4 days 12/05/16   Nicanor Alcon, April, MD    Family History History reviewed. No pertinent family history.  Social History Social History   Tobacco Use   Smoking status: Never Smoker   Smokeless tobacco: Never Used  Substance Use Topics   Alcohol use: No   Drug use: No     Allergies   Patient has no known allergies.   Review of Systems Review of Systems  Constitutional: Negative for activity change,  appetite change, chills, fatigue and fever.  HENT: Positive for ear pain. Negative for congestion, rhinorrhea, sinus pressure, sore throat and trouble swallowing.   Eyes: Negative for discharge and redness.  Respiratory: Negative for cough, chest tightness and shortness of breath.   Cardiovascular: Negative for chest pain.  Gastrointestinal: Negative for abdominal pain, diarrhea, nausea and vomiting.  Musculoskeletal: Negative for myalgias.  Skin: Negative for rash.  Neurological: Negative for dizziness, light-headedness and headaches.     Physical Exam Triage Vital Signs ED Triage Vitals  Enc Vitals Group     BP 12/16/19 1811 120/76     Pulse Rate 12/16/19 1811 85     Resp 12/16/19 1811 16     Temp 12/16/19 1811 98.7 F (37.1 C)     Temp Source 12/16/19 1811 Oral     SpO2 12/16/19 1811 100 %     Weight --      Height --      Head Circumference --      Peak Flow --      Pain Score 12/16/19 1812 6     Pain Loc --      Pain Edu? --      Excl. in GC? --    No data found.  Updated Vital Signs BP 120/76 (BP Location: Right Arm)    Pulse 85    Temp 98.7 F (37.1 C) (Oral)  Resp 16    SpO2 100%   Visual Acuity Right Eye Distance:   Left Eye Distance:   Bilateral Distance:    Right Eye Near:   Left Eye Near:    Bilateral Near:     Physical Exam Vitals and nursing note reviewed.  Constitutional:      Appearance: She is well-developed.     Comments: No acute distress  HENT:     Head: Normocephalic and atraumatic.     Ears:     Comments: Left canal mildly erythematous without edema or discharge, TM intact, but appears dull striated opaque and slightly erythematous    Nose: Nose normal.     Mouth/Throat:     Comments: Oral mucosa pink and moist, no tonsillar enlargement or exudate. Posterior pharynx patent and nonerythematous, no uvula deviation or swelling. Normal phonation. Eyes:     Conjunctiva/sclera: Conjunctivae normal.  Cardiovascular:     Rate and Rhythm:  Normal rate.  Pulmonary:     Effort: Pulmonary effort is normal. No respiratory distress.  Abdominal:     General: There is no distension.  Musculoskeletal:        General: Normal range of motion.     Cervical back: Neck supple.  Skin:    General: Skin is warm and dry.  Neurological:     Mental Status: She is alert and oriented to person, place, and time.      UC Treatments / Results  Labs (all labs ordered are listed, but only abnormal results are displayed) Labs Reviewed - No data to display  EKG   Radiology No results found.  Procedures Procedures (including critical care time)  Medications Ordered in UC Medications - No data to display  Initial Impression / Assessment and Plan / UC Course  I have reviewed the triage vital signs and the nursing notes.  Pertinent labs & imaging results that were available during my care of the patient were reviewed by me and considered in my medical decision making (see chart for details).     Treating for otitis media with Augmentin twice daily x1 week, ibuprofen and Tylenol for pain.  Diflucan for a yeast infection from antibiotics.  Monitor for gradual resolution, follow-up if not improving or worsening.  Discussed strict return precautions. Patient verbalized understanding and is agreeable with plan.  Final Clinical Impressions(s) / UC Diagnoses   Final diagnoses:  Non-recurrent acute suppurative otitis media of left ear without spontaneous rupture of tympanic membrane     Discharge Instructions     Augmentin twice daily for 1 week Ibuprofen and tylenol for pain Diflucan if needed for yeast   ED Prescriptions    Medication Sig Dispense Auth. Provider   amoxicillin-clavulanate (AUGMENTIN) 875-125 MG tablet Take 1 tablet by mouth every 12 (twelve) hours for 7 days. 14 tablet [place] W[place] C, PA-C   fluconazole (DIFLUCAN) 150 MG tablet Take 1 tablet (150 mg total) by mouth once for 1 dose. 2 tablet Tkeya Stencil, [place] C,  PA-C     PDMP not reviewed this encounter.   Lew Dawes, [place] 12/16/19 1836

## 2019-12-16 NOTE — Discharge Instructions (Signed)
Augmentin twice daily for 1 week Ibuprofen and tylenol for pain Diflucan if needed for yeast

## 2021-12-15 ENCOUNTER — Emergency Department (HOSPITAL_BASED_OUTPATIENT_CLINIC_OR_DEPARTMENT_OTHER): Payer: Self-pay

## 2021-12-15 ENCOUNTER — Emergency Department (HOSPITAL_BASED_OUTPATIENT_CLINIC_OR_DEPARTMENT_OTHER)
Admission: EM | Admit: 2021-12-15 | Discharge: 2021-12-15 | Disposition: A | Discharge status: Home / Self Care | Payer: Self-pay | Attending: Emergency Medicine | Admitting: Emergency Medicine

## 2021-12-15 ENCOUNTER — Other Ambulatory Visit: Payer: Self-pay

## 2021-12-15 ENCOUNTER — Encounter (HOSPITAL_BASED_OUTPATIENT_CLINIC_OR_DEPARTMENT_OTHER): Payer: Self-pay | Admitting: Emergency Medicine

## 2021-12-15 DIAGNOSIS — Z20822 Contact with and (suspected) exposure to covid-19: Secondary | ICD-10-CM | POA: Insufficient documentation

## 2021-12-15 DIAGNOSIS — J4541 Moderate persistent asthma with (acute) exacerbation: Secondary | ICD-10-CM

## 2021-12-15 DIAGNOSIS — J209 Acute bronchitis, unspecified: Secondary | ICD-10-CM | POA: Insufficient documentation

## 2021-12-15 LAB — RESP PANEL BY RT-PCR (FLU A&B, COVID) ARPGX2
Influenza A by PCR: NEGATIVE
Influenza B by PCR: NEGATIVE
SARS Coronavirus 2 by RT PCR: NEGATIVE

## 2021-12-15 MED ORDER — ALBUTEROL SULFATE (2.5 MG/3ML) 0.083% IN NEBU
2.5000 mg | INHALATION_SOLUTION | Freq: Once | RESPIRATORY_TRACT | Status: AC
  Administered 2021-12-15: 2.5 mg via RESPIRATORY_TRACT
  Filled 2021-12-15: qty 3

## 2021-12-15 MED ORDER — ALBUTEROL SULFATE HFA 108 (90 BASE) MCG/ACT IN AERS
2.0000 | INHALATION_SPRAY | Freq: Four times a day (QID) | RESPIRATORY_TRACT | Status: DC | PRN
  Administered 2021-12-15: 2 via RESPIRATORY_TRACT
  Filled 2021-12-15: qty 6.7

## 2021-12-15 MED ORDER — AEROCHAMBER PLUS FLO-VU MEDIUM MISC
1.0000 | Freq: Once | Status: AC
  Administered 2021-12-15: 1
  Filled 2021-12-15: qty 1

## 2021-12-15 NOTE — ED Notes (Signed)
Port X-ray at bedside.  

## 2021-12-15 NOTE — ED Triage Notes (Signed)
Pt arrives pov, steady gait, c/o slightly productive cough, reports making it difficult to breath x 1 week, that is not responding to otc meds. Denies fever

## 2021-12-15 NOTE — ED Notes (Signed)
States that she has been coughing for a week .States that it is most a dry cough some time mucus come up

## 2021-12-15 NOTE — ED Notes (Signed)
Patient educated on the use of a MDI and spacer. Patient demonstrated good effort and understanding.

## 2021-12-15 NOTE — Discharge Instructions (Signed)
Use albuterol inhaler 2 puffs every 6 hours.  You can take your Mucinex DM with this as well.  Today's work-up chest x-ray negative for pneumonia.  Work note provided.  Return for any new or worse symptoms.

## 2021-12-15 NOTE — ED Provider Notes (Signed)
[facility] EMERGENCY DEPARTMENT Provider Note   CSN: [id number] Arrival date & time: 12/15/21  4034     History  Chief Complaint  Patient presents with   Cough    Julia Bridges is a 31 y.o. female.  Patient with 1 week history of cough and congestion and starting here more recently feels as if she is having wheezing and difficulty breathing.  No one sick at home.  Patient does not have a history of asthma or emphysema.  Patient is never used tobacco products past medical history noncontributory.       Home Medications Prior to Admission medications   Medication Sig Start Date End Date Taking? Authorizing Provider  HYDROcodone-acetaminophen (NORCO/VICODIN) 5-325 MG per tablet Take 1 tablet by mouth every 6 (six) hours as needed. 05/29/14   Horton, Mayer Masker, MD  loratadine (CLARITIN) 10 MG tablet Take 1 tablet (10 mg total) by mouth daily. 12/05/16   Palumbo, April, MD  predniSONE (DELTASONE) 20 MG tablet 3 tabs po day one, then 2 po daily x 4 days 12/05/16   Nicanor Alcon, April, MD      Allergies    Patient has no known allergies.    Review of Systems   Review of Systems  Constitutional:  Negative for chills and fever.  HENT:  Positive for congestion. Negative for rhinorrhea and sore throat.   Eyes:  Negative for visual disturbance.  Respiratory:  Positive for cough, shortness of breath and wheezing. Negative for stridor.   Cardiovascular:  Negative for chest pain and leg swelling.  Gastrointestinal:  Negative for abdominal pain, diarrhea, nausea and vomiting.  Genitourinary:  Negative for dysuria.  Musculoskeletal:  Negative for back pain and neck pain.  Skin:  Negative for rash.  Neurological:  Negative for dizziness, light-headedness and headaches.  Hematological:  Does not bruise/bleed easily.  Psychiatric/Behavioral:  Negative for confusion.     Physical Exam Updated Vital Signs BP 120/84   Pulse 90   Temp 98.3 F (36.8 C) (Oral)   Resp 20    Ht 1.727 m (5\' 8" )   Wt 86.2 kg   LMP 11/24/2021   SpO2 98%   BMI 28.89 kg/m  Physical Exam Vitals and nursing note reviewed.  Constitutional:      General: She is not in acute distress.    Appearance: Normal appearance. She is well-developed.  HENT:     Head: Normocephalic and atraumatic.  Eyes:     Extraocular Movements: Extraocular movements intact.     Conjunctiva/sclera: Conjunctivae normal.     Pupils: Pupils are equal, round, and reactive to light.  Cardiovascular:     Rate and Rhythm: Normal rate and regular rhythm.     Heart sounds: No murmur heard. Pulmonary:     Effort: Pulmonary effort is normal. No respiratory distress.     Breath sounds: No stridor. Wheezing and rhonchi present. No rales.  Abdominal:     Palpations: Abdomen is soft.     Tenderness: There is no abdominal tenderness.  Musculoskeletal:        General: No swelling.     Cervical back: Normal range of motion and neck supple.  Skin:    General: Skin is warm and dry.     Capillary Refill: Capillary refill takes less than 2 seconds.  Neurological:     General: No focal deficit present.     Mental Status: She is alert and oriented to person, place, and time.  Psychiatric:  Mood and Affect: Mood normal.     ED Results / Procedures / Treatments   Labs (all labs ordered are listed, but only abnormal results are displayed) Labs Reviewed  RESP PANEL BY RT-PCR (FLU A&B, COVID) ARPGX2    EKG None  Radiology No results found.  Procedures Procedures    Medications Ordered in ED Medications  albuterol (PROVENTIL) (2.5 MG/3ML) 0.083% nebulizer solution 2.5 mg (has no administration in time range)    ED Course/ Medical Decision Making/ A&P                           Medical Decision Making Amount and/or Complexity of Data Reviewed Radiology: ordered.  Risk Prescription drug management.   Patient clinically seems if she is got a bronchitis secondary to upper respiratory  infection.  We will check for COVID and flu.  We will get chest x-ray.  She is wheezing we will give an albuterol inhaler here and reassess.   Final Clinical Impression(s) / ED Diagnoses Final diagnoses:  Acute bronchitis, unspecified organism    Rx / DC Orders ED Discharge Orders     None         Fredia Sorrow, MD 12/15/21 [phone]

## 2022-01-25 DIAGNOSIS — Z419 Encounter for procedure for purposes other than remedying health state, unspecified: Secondary | ICD-10-CM | POA: Diagnosis not present

## 2022-02-25 DIAGNOSIS — Z419 Encounter for procedure for purposes other than remedying health state, unspecified: Secondary | ICD-10-CM | POA: Diagnosis not present

## 2022-03-28 DIAGNOSIS — Z419 Encounter for procedure for purposes other than remedying health state, unspecified: Secondary | ICD-10-CM | POA: Diagnosis not present

## 2022-04-26 DIAGNOSIS — Z419 Encounter for procedure for purposes other than remedying health state, unspecified: Secondary | ICD-10-CM | POA: Diagnosis not present

## 2022-05-27 DIAGNOSIS — Z419 Encounter for procedure for purposes other than remedying health state, unspecified: Secondary | ICD-10-CM | POA: Diagnosis not present

## 2022-06-26 DIAGNOSIS — Z419 Encounter for procedure for purposes other than remedying health state, unspecified: Secondary | ICD-10-CM | POA: Diagnosis not present

## 2022-07-27 DIAGNOSIS — Z419 Encounter for procedure for purposes other than remedying health state, unspecified: Secondary | ICD-10-CM | POA: Diagnosis not present

## 2022-08-26 DIAGNOSIS — Z419 Encounter for procedure for purposes other than remedying health state, unspecified: Secondary | ICD-10-CM | POA: Diagnosis not present

## 2022-09-26 DIAGNOSIS — Z419 Encounter for procedure for purposes other than remedying health state, unspecified: Secondary | ICD-10-CM | POA: Diagnosis not present

## 2022-10-27 DIAGNOSIS — Z419 Encounter for procedure for purposes other than remedying health state, unspecified: Secondary | ICD-10-CM | POA: Diagnosis not present

## 2022-11-26 DIAGNOSIS — Z419 Encounter for procedure for purposes other than remedying health state, unspecified: Secondary | ICD-10-CM | POA: Diagnosis not present

## 2022-12-27 DIAGNOSIS — Z419 Encounter for procedure for purposes other than remedying health state, unspecified: Secondary | ICD-10-CM | POA: Diagnosis not present

## 2023-01-26 DIAGNOSIS — Z419 Encounter for procedure for purposes other than remedying health state, unspecified: Secondary | ICD-10-CM | POA: Diagnosis not present

## 2023-02-26 DIAGNOSIS — Z419 Encounter for procedure for purposes other than remedying health state, unspecified: Secondary | ICD-10-CM | POA: Diagnosis not present

## 2023-04-18 ENCOUNTER — Ambulatory Visit
Admission: EM | Admit: 2023-04-18 | Discharge: 2023-04-18 | Disposition: A | Discharge status: Home / Self Care | Payer: Medicaid Other | Attending: Family Medicine | Admitting: Family Medicine

## 2023-04-18 DIAGNOSIS — H6993 Unspecified Eustachian tube disorder, bilateral: Secondary | ICD-10-CM

## 2023-04-18 MED ORDER — FLUTICASONE PROPIONATE 50 MCG/ACT NA SUSP: 1.0000 | Freq: Every day | NASAL | 0 refills | Status: DC

## 2023-04-18 MED ORDER — PREDNISONE 20 MG PO TABS: 40.0000 mg | ORAL_TABLET | Freq: Every day | ORAL | 0 refills | Status: AC

## 2023-04-18 MED ORDER — CETIRIZINE HCL 10 MG PO TABS: 10.0000 mg | ORAL_TABLET | Freq: Every day | ORAL | 0 refills | Status: DC

## 2023-04-18 NOTE — Discharge Instructions (Addendum)
 Start Flonase daily.  Start cetirizine daily as well. Start prednisone daily for 5 days.  Please follow-up with your PCP if your symptoms do not improve.  Please go to the ER for any worsening symptoms.  Hope you feel better soon!

## 2023-04-18 NOTE — ED Triage Notes (Signed)
 Pt presents to UC for c/o bilateral ear pain x1 week. Pt states she had a cold prior to ear pain. Tried ear drops which made it worse Reports crackling and popping in both ears.

## 2023-04-18 NOTE — ED Provider Notes (Signed)
 [facility]    CSN: [id number] Arrival date & time: 04/18/23  1807      History   Chief Complaint No chief complaint on file.   HPI Julia Bridges is a 33 y.o. female presents for ear pain.  Patient reports 1 week of bilateral ear pain/popping/crackling sounds.  States she had upper respiratory symptoms such as cough and congestion that have since resolved.  Denies any fevers or drainage from the ears.  Reports a history of ear infections in the past.  She tried some OTC eardrops that made her symptoms worse.  No other concerns at this time.  HPI  History reviewed. No pertinent past medical history.  There are no active problems to display for this patient.   History reviewed. No pertinent surgical history.  OB History   No obstetric history on file.      Home Medications    Prior to Admission medications   Medication Sig Start Date End Date Taking? Authorizing Provider  cetirizine (ZYRTEC ALLERGY) 10 MG tablet Take 1 tablet (10 mg total) by mouth daily. 04/18/23  Yes Radford Pax, NP  fluticasone (FLONASE) 50 MCG/ACT nasal spray Place 1 spray into both nostrils daily. 04/18/23  Yes Radford Pax, NP  predniSONE (DELTASONE) 20 MG tablet Take 2 tablets (40 mg total) by mouth daily with breakfast for 5 days. 04/18/23 04/23/23 Yes Radford Pax, NP  HYDROcodone-acetaminophen (NORCO/VICODIN) 5-325 MG per tablet Take 1 tablet by mouth every 6 (six) hours as needed. 05/29/14   Horton, Montez Cuda Masker, MD  loratadine (CLARITIN) 10 MG tablet Take 1 tablet (10 mg total) by mouth daily. 12/05/16   Palumbo, April, MD    Family History History reviewed. No pertinent family history.  Social History Social History   Tobacco Use   Smoking status: Never   Smokeless tobacco: Never  Substance Use Topics   Alcohol use: No   Drug use: No     Allergies   Patient has no known allergies.   Review of Systems Review of Systems  HENT:  Positive for ear pain.       Physical Exam Triage Vital Signs ED Triage Vitals  Encounter Vitals Group     BP 04/18/23 1833 121/79     Systolic BP Percentile --      Diastolic BP Percentile --      Pulse Rate 04/18/23 1833 86     Resp 04/18/23 1833 16     Temp 04/18/23 1833 97.8 F (36.6 C)     Temp Source 04/18/23 1833 Oral     SpO2 04/18/23 1833 98 %     Weight --      Height --      Head Circumference --      Peak Flow --      Pain Score 04/18/23 1830 6     Pain Loc --      Pain Education --      Exclude from Growth Chart --    No data found.  Updated Vital Signs BP 121/79 (BP Location: Right Arm)   Pulse 86   Temp 97.8 F (36.6 C) (Oral)   Resp 16   SpO2 98%   Visual Acuity Right Eye Distance:   Left Eye Distance:   Bilateral Distance:    Right Eye Near:   Left Eye Near:    Bilateral Near:     Physical Exam Vitals and nursing note reviewed.  Constitutional:  General: She is not in acute distress.    Appearance: She is well-developed. She is not ill-appearing.  HENT:     Head: Normocephalic and atraumatic.     Right Ear: Ear canal normal. A middle ear effusion is present. Tympanic membrane is not erythematous.     Left Ear: Ear canal normal. A middle ear effusion is present. Tympanic membrane is not erythematous.     Mouth/Throat:     Mouth: Mucous membranes are moist.     Pharynx: Oropharynx is clear. Uvula midline.     Tonsils: No tonsillar exudate or tonsillar abscesses.  Eyes:     Conjunctiva/sclera: Conjunctivae normal.     Pupils: Pupils are equal, round, and reactive to light.  Cardiovascular:     Rate and Rhythm: Normal rate.  Pulmonary:     Effort: Pulmonary effort is normal.  Musculoskeletal:     Cervical back: Normal range of motion and neck supple.  Lymphadenopathy:     Cervical: No cervical adenopathy.  Skin:    General: Skin is warm and dry.  Neurological:     General: No focal deficit present.     Mental Status: She is alert and oriented to  person, place, and time.  Psychiatric:        Mood and Affect: Mood normal.        Behavior: Behavior normal.      UC Treatments / Results  Labs (all labs ordered are listed, but only abnormal results are displayed) Labs Reviewed - No data to display  EKG   Radiology No results found.  Procedures Procedures (including critical care time)  Medications Ordered in UC Medications - No data to display  Initial Impression / Assessment and Plan / UC Course  I have reviewed the triage vital signs and the nursing notes.  Pertinent labs & imaging results that were available during my care of the patient were reviewed by me and considered in my medical decision making (see chart for details).     Reviewed exam and symptoms with patient.  No red flags.  Start Flonase, cetirizine, prednisone.  Vies PCP follow-up if symptoms do not improve.  ER precautions reviewed. Final Clinical Impressions(s) / UC Diagnoses   Final diagnoses:  Eustachian tube dysfunction, bilateral     Discharge Instructions      Start Flonase daily.  Start cetirizine daily as well. Start prednisone daily for 5 days.  Please follow-up with your PCP if your symptoms do not improve.  Please go to the ER for any worsening symptoms.  Hope you feel better soon!    ED Prescriptions     Medication Sig Dispense Auth. Provider   fluticasone (FLONASE) 50 MCG/ACT nasal spray Place 1 spray into both nostrils daily. 15.8 mL Radford Pax, NP   cetirizine (ZYRTEC ALLERGY) 10 MG tablet Take 1 tablet (10 mg total) by mouth daily. 7 tablet Radford Pax, NP   predniSONE (DELTASONE) 20 MG tablet Take 2 tablets (40 mg total) by mouth daily with breakfast for 5 days. 10 tablet Radford Pax, NP      PDMP not reviewed this encounter.   Radford Pax, NP 04/18/23 [phone]

## 2023-05-16 ENCOUNTER — Ambulatory Visit: Payer: Self-pay | Admitting: Family Medicine

## 2023-05-16 ENCOUNTER — Encounter: Payer: Self-pay | Admitting: Family Medicine

## 2023-05-16 ENCOUNTER — Ambulatory Visit (INDEPENDENT_AMBULATORY_CARE_PROVIDER_SITE_OTHER): Payer: Self-pay | Admitting: Family Medicine

## 2023-05-16 VITALS — BP 109/76 | HR 87 | Temp 97.1°F | Resp 16 | Ht 68.0 in | Wt 186.5 lb

## 2023-05-16 DIAGNOSIS — Z13220 Encounter for screening for lipoid disorders: Secondary | ICD-10-CM

## 2023-05-16 DIAGNOSIS — Z1159 Encounter for screening for other viral diseases: Secondary | ICD-10-CM | POA: Diagnosis not present

## 2023-05-16 DIAGNOSIS — Z23 Encounter for immunization: Secondary | ICD-10-CM | POA: Diagnosis not present

## 2023-05-16 DIAGNOSIS — Z0000 Encounter for general adult medical examination without abnormal findings: Secondary | ICD-10-CM | POA: Diagnosis not present

## 2023-05-16 DIAGNOSIS — Z131 Encounter for screening for diabetes mellitus: Secondary | ICD-10-CM

## 2023-05-16 DIAGNOSIS — H6993 Unspecified Eustachian tube disorder, bilateral: Secondary | ICD-10-CM | POA: Diagnosis not present

## 2023-05-16 LAB — LIPID PANEL
Cholesterol: 163 mg/dL (ref 0–200)
HDL: 49.4 mg/dL (ref >39.00)
LDL Cholesterol: 100 mg/dL — ABNORMAL HIGH (ref 0–99)
NonHDL: 113.1
Total CHOL/HDL Ratio: 3
Triglycerides: 68 mg/dL (ref 0.0–149.0)
VLDL: 13.6 mg/dL (ref 0.0–40.0)

## 2023-05-16 LAB — CBC WITH DIFFERENTIAL/PLATELET
Basophils Absolute: 0 10*3/uL (ref 0.0–0.1)
Basophils Relative: 0.4 % (ref 0.0–3.0)
Eosinophils Absolute: 0.4 10*3/uL (ref 0.0–0.7)
Eosinophils Relative: 6.9 % — ABNORMAL HIGH (ref 0.0–5.0)
HCT: 39.4 % (ref 36.0–46.0)
Hemoglobin: 12.9 g/dL (ref 12.0–15.0)
Lymphocytes Relative: 40 % (ref 12.0–46.0)
Lymphs Abs: 2.3 10*3/uL (ref 0.7–4.0)
MCHC: 32.7 g/dL (ref 30.0–36.0)
MCV: 84.6 fl (ref 78.0–100.0)
Monocytes Absolute: 0.6 10*3/uL (ref 0.1–1.0)
Monocytes Relative: 11.2 % (ref 3.0–12.0)
Neutro Abs: 2.4 10*3/uL (ref 1.4–7.7)
Neutrophils Relative %: 41.5 % — ABNORMAL LOW (ref 43.0–77.0)
Platelets: 291 10*3/uL (ref 150.0–400.0)
RBC: 4.66 Mil/uL (ref 3.87–5.11)
RDW: 16.3 % — ABNORMAL HIGH (ref 11.5–15.5)
WBC: 5.8 10*3/uL (ref 4.0–10.5)

## 2023-05-16 LAB — COMPREHENSIVE METABOLIC PANEL
ALT: 8 U/L (ref 0–35)
AST: 16 U/L (ref 0–37)
Albumin: 4.6 g/dL (ref 3.5–5.2)
Alkaline Phosphatase: 59 U/L (ref 39–117)
BUN: 8 mg/dL (ref 6–23)
CO2: 27 meq/L (ref 19–32)
Calcium: 9.3 mg/dL (ref 8.4–10.5)
Chloride: 103 meq/L (ref 96–112)
Creatinine, Ser: 0.58 mg/dL (ref 0.40–1.20)
GFR: 119.43 mL/min (ref >60.00)
Glucose, Bld: 81 mg/dL (ref 70–99)
Potassium: 3.9 meq/L (ref 3.5–5.1)
Sodium: 137 meq/L (ref 135–145)
Total Bilirubin: 0.4 mg/dL (ref 0.2–1.2)
Total Protein: 7.9 g/dL (ref 6.0–8.3)

## 2023-05-16 LAB — HEMOGLOBIN A1C: Hgb A1c MFr Bld: 5.6 % (ref 4.6–6.5)

## 2023-05-16 LAB — TSH: TSH: 2.12 u[IU]/mL (ref 0.35–5.50)

## 2023-05-16 MED ORDER — PREDNISONE 20 MG PO TABS: 40.0000 mg | ORAL_TABLET | Freq: Every day | ORAL | 0 refills | Status: AC

## 2023-05-16 NOTE — Patient Instructions (Addendum)
 Welcome to [facility]! It was a pleasure meeting you today.  As discussed, Please schedule a 3 month follow up visit today.  Benefiber, walker, water, massage  PLEASE NOTE:  If you had any LAB tests please let us know if you have not heard back within a few days. You may see your results on MyChart before we have a chance to review them but we will give you a call once they are reviewed by [place]. If we ordered any REFERRALS today, please let us know if you have not heard from their office within the next week.  Let us know through MyChart if you are needing REFILLS, or have your pharmacy send [place] the request. You can also use MyChart to communicate with me or any office staff.  Please try these tips to maintain a healthy lifestyle:  Eat most of your calories during the day when you are active. Eliminate processed foods including packaged sweets (pies, cakes, cookies), reduce intake of potatoes, white bread, white pasta, and white rice. Look for whole grain options, oat flour or almond flour.  Each meal should contain half fruits/vegetables, one quarter protein, and one quarter carbs (no bigger than a computer mouse).  Cut down on sweet beverages. This includes juice, soda, and sweet tea. Also watch fruit intake, though this is a healthier sweet option, it still contains natural sugar! Limit to 3 servings daily.  Drink at least 1 glass of water with each meal and aim for at least 8 glasses per day  Exercise at least 150 minutes every week.

## 2023-05-16 NOTE — Progress Notes (Signed)
 Phone [phone]   Subjective:   Patient is a 33 y.o. female presenting for annual physical.    Chief Complaint  Patient presents with   Establish Care    Initial visit to establish care with new pcp Fasting    Annual-from [place].  Not exercising now Fluid in middle ear 1 mo ago and better, but now back. Pressure and popping B ears.  Discussed the use of AI scribe software for clinical note transcription with the patient, who gave verbal consent to proceed.  History of Present Illness   Julia Bridges is a 33 year old female who presents for a physical exam and to establish care.  She has been experiencing pressure and popping in both ears for about a month. Initially treated at an urgent care with Flonase, Claritin, and Zyrtec, her symptoms improved but then returned. She continues to use Flonase, though its effectiveness has decreased. No change in hearing, drainage, runny nose, congestion, or sore throat is noted. The pressure is not associated with pain.  She experiences constipation, which she attributes to decreased physical activity and insufficient water intake during Ramadan. She plans to resume exercising after Ramadan and has previously found that walking and a better diet improved her bowel movements. She has a history of constipation since childhood and has used Benefiber in the past.  No blurry vision, double vision, headaches, dizziness, chest pains, heart racing, coughing, wheezing, shortness of breath, vomiting, diarrhea, muscle aches, joint pains, or suicidal thoughts. Regular menstrual periods and no issues with urination. She does not use drugs, drink alcohol, smoke, or vape. 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 She recently returned from a few months in [place] and is originally from [place].       See problem oriented charting- ROS- ROS: Gen: no fever, chills  Skin: no rash, itching ENT: no  ear drainage,  nasal congestion, rhinorrhea, sinus pressure, sore throat Eyes: no blurry vision, double vision Resp: no cough, wheeze,SOB CV: no CP, palpitations, LE edema,  GI: no heartburn, n/v/d, abd pain GU: no dysuria, urgency, frequency, hematuria.  Menses regular. MSK: no joint pain, myalgias, back pain Neuro: no dizziness, headache, weakness, vertigo Psych: no depression, anxiety, insomnia, SI   The following were reviewed and entered/updated in epic: Past Medical History:  Diagnosis Date   Allergy    There are no active problems to display for this patient.  History reviewed. No pertinent surgical history.  Family History  Problem Relation Age of Onset   Hypertension Mother    Hyperlipidemia Mother    Diabetes Mother    Arthritis Mother    Hypertension Father    Hyperlipidemia Father    Arthritis Father    Hypertension Brother    Arthritis Maternal Grandmother    Arthritis Maternal Grandfather    Arthritis Paternal Grandmother    Arthritis Paternal Grandfather     Medications- reviewed and updated Current Outpatient Medications  Medication Sig Dispense Refill   fluticasone (FLONASE) 50 MCG/ACT nasal spray Place 1 spray into both nostrils daily. 15.8 mL 0   predniSONE (DELTASONE) 20 MG tablet Take 2 tablets (40 mg total) by mouth daily with breakfast for 5 days. 10 tablet 0   cetirizine (ZYRTEC ALLERGY) 10 MG tablet Take 1 tablet (10 mg total) by mouth daily. (Patient not taking: Reported on 05/16/2023) 7 tablet 0   No current facility-administered medications for this visit.    Allergies-reviewed and updated  No Known Allergies  Social History   Social History Public house manager   Objective  Objective:  BP 109/76   Pulse 87   Temp (!) 97.1 F (36.2 C) (Temporal)   Resp 16   Ht 5\' 8"  (1.727 m)   Wt 186 lb 8 oz (84.6 kg)   LMP 04/26/2023 (Approximate)   SpO2 100%   BMI 28.36 kg/m  Physical Exam  Gen: WDWN NAD HEENT: NCAT, conjunctiva not  injected, sclera nonicteric TM WNL B, OP moist, no exudates  NECK:  supple, no thyromegaly, no nodes, no carotid bruits-felt ears pop when palp neck CARDIAC: RRR, S1S2+, no murmur. DP 2+B LUNGS: CTAB. No wheezes ABDOMEN:  BS+, soft, NTND, No HSM, no masses EXT:  no edema MSK: no gross abnormalities. MS 5/5 all 4 NEURO: A&O x3.  CN II-XII intact.  PSYCH: normal mood. Good eye contact      Assessment and Plan   Health Maintenance counseling: 1. Anticipatory guidance: Patient counseled regarding regular dental exams q6 months, eye exams,  avoiding smoking and second hand smoke, limiting alcohol to 1 beverage per day, no illicit drugs.   2. Risk factor reduction:  Advised patient of need for regular exercise and diet rich and fruits and vegetables to reduce risk of heart attack and stroke. Exercise- encourage.  Wt Readings from Last 3 Encounters:  05/16/23 186 lb 8 oz (84.6 kg)  12/15/21 190 lb (86.2 kg)  12/05/16 170 lb (77.1 kg)   3. Immunizations/screenings/ancillary studies Immunization History  Administered Date(s) Administered   Fluzone Influenza virus vaccine,trivalent (IIV3), split virus 01/30/2008   Hep B, Unspecified 02/07/1998, 03/10/1998, 10/27/1998   IPV 02/07/1998, 03/10/1998, 10/27/1998   MMR 02/07/1998, 03/10/1998   Novel Infuenza-h1n1-09 01/30/2008   Td 02/07/1998, 03/10/1998, 10/27/1998   Tdap 10/17/2008, 06/16/2010, 05/29/2014   Varicella 05/18/1999   Health Maintenance Due  Topic Date Due   HIV Screening  Never done   Hepatitis C Screening  Never done   Cervical Cancer Screening (HPV/Pap Cotest)  Never done    4. Cervical cancer screening: needs 5. Skin cancer screening- advised regular sunscreen use. Denies worrisome, changing, or new skin lesions.  6. Birth control/STD check: n/a 7. Smoking associated screening: non smoker 8. Alcohol screening: noa  Wellness examination -     Lipid panel -     Comprehensive metabolic panel -     CBC with  Differential/Platelet -     Hemoglobin A1c -     TSH -     Hepatitis C antibody -     HIV Antibody (routine testing w rflx)  Screening for viral disease -     Hepatitis C antibody -     HIV Antibody (routine testing w rflx)  Dysfunction of both eustachian tubes  Other orders -     predniSONE; Take 2 tablets (40 mg total) by mouth daily with breakfast for 5 days.  Dispense: 10 tablet; Refill: 0  Wellness-anticipatory guidance.  Work on Diet/Exercise  Check CBC,CMP,lipids,TSH, A1C.  F/u 1 yr   Tdap given  Assessment and Plan    Middle Ear Effusion   She has been experiencing pressure and popping in both ears for about a month following a cold or sinus infection. Symptoms initially improved with Flonase, Claritin, and Zyrtec but have since returned. There are no changes in hearing or drainage. Post-cold ear effusions can take up to three months to resolve. To expedite recovery by reducing inflammation, prescribe a course of  oral steroids to be taken with food once daily. Continue using Flonase nasal spray. Consider adding Zyrtec if symptoms persist.  Constipation   Constipation is attributed to decreased physical activity and fluid intake during Ramadan. She has experienced constipation since childhood, with improvement noted with increased activity and hydration. Plans to resume physical activity post-Ramadan. Increase physical activity and water intake, especially after Ramadan. Consider using Benefiber to increase dietary fiber. If constipation persists, consider Colace or Miralax.  General Health Maintenance   She is overdue for a Pap smear and tetanus vaccination. Not sexually active and has regular menstrual cycles. Her BMI is 27; advised to aim for a BMI under 25 to reduce diabetes risk, given family history. Fasting for Ramadan affects hydration and activity levels. Administer tetanus vaccination today. Schedule a Pap smear at a convenient time. Encourage a balanced diet with reduced  intake of bread, rice, pasta, potatoes, and sugars. Encourage regular physical activity post-Ramadan.        Recommended follow up: Return in about 3 months (around 08/16/2023) for pap.  Lab/Order associations:+ fasting   Angelena Sole, MD

## 2023-05-17 LAB — HIV ANTIBODY (ROUTINE TESTING W REFLEX): HIV 1&2 Ab, 4th Generation: NONREACTIVE

## 2023-05-17 LAB — HEPATITIS C ANTIBODY: Hepatitis C Ab: NONREACTIVE

## 2023-05-18 ENCOUNTER — Encounter: Payer: Self-pay | Admitting: Family Medicine

## 2023-05-18 NOTE — Progress Notes (Signed)
 Labs are great

## 2023-05-23 ENCOUNTER — Emergency Department (HOSPITAL_BASED_OUTPATIENT_CLINIC_OR_DEPARTMENT_OTHER)
Admission: EM | Admit: 2023-05-23 | Discharge: 2023-05-24 | Disposition: A | Discharge status: Home / Self Care | Attending: Emergency Medicine | Admitting: Emergency Medicine

## 2023-05-23 ENCOUNTER — Emergency Department (HOSPITAL_BASED_OUTPATIENT_CLINIC_OR_DEPARTMENT_OTHER): Admitting: Radiology

## 2023-05-23 ENCOUNTER — Encounter (HOSPITAL_BASED_OUTPATIENT_CLINIC_OR_DEPARTMENT_OTHER): Payer: Self-pay

## 2023-05-23 ENCOUNTER — Other Ambulatory Visit: Payer: Self-pay

## 2023-05-23 DIAGNOSIS — W1840XA Slipping, tripping and stumbling without falling, unspecified, initial encounter: Secondary | ICD-10-CM | POA: Insufficient documentation

## 2023-05-23 DIAGNOSIS — S99912A Unspecified injury of left ankle, initial encounter: Secondary | ICD-10-CM | POA: Diagnosis present

## 2023-05-23 DIAGNOSIS — S93402A Sprain of unspecified ligament of left ankle, initial encounter: Secondary | ICD-10-CM | POA: Insufficient documentation

## 2023-05-23 NOTE — ED Triage Notes (Signed)
 Pt arrived POV d/t left ankle injury by tripping over a tree root around 1100 today.  It has progressively gotten worse.

## 2023-05-24 DIAGNOSIS — S93492A Sprain of other ligament of left ankle, initial encounter: Secondary | ICD-10-CM | POA: Diagnosis not present

## 2023-05-24 NOTE — ED Provider Notes (Signed)
  [place] EMERGENCY DEPARTMENT AT [facility] Provider Note   CSN: [id number] Arrival date & time: 05/23/23  2105     History  Chief Complaint  Patient presents with   Ankle Injury    Julia Bridges is a 33 y.o. female.  Patient is a 33 year old female presenting with a left ankle injury.  She reports stepping on a root and inverting it.  She has had pain and swelling since.  Injury occurred at approximately 11 AM.  Pain is worse with weightbearing and ambulation.  No alleviating factors.       Home Medications Prior to Admission medications   Medication Sig Start Date End Date Taking? Authorizing Provider  cetirizine (ZYRTEC ALLERGY) 10 MG tablet Take 1 tablet (10 mg total) by mouth daily. Patient not taking: Reported on 05/16/2023 04/18/23   Radford Pax, NP  fluticasone [facility]) 50 MCG/ACT nasal spray Place 1 spray into both nostrils daily. 04/18/23   Radford Pax, NP      Allergies    Patient has no known allergies.    Review of Systems   Review of Systems  All other systems reviewed and are negative.   Physical Exam Updated Vital Signs BP 132/89   Pulse 93   Temp 98.2 F (36.8 C) (Oral)   Resp 18   Ht 5\' 8"  (1.727 m)   Wt 84.6 kg   LMP 05/22/2023 (Approximate)   SpO2 100%   BMI 28.36 kg/m  Physical Exam Vitals and nursing note reviewed.  Constitutional:      Appearance: Normal appearance.  Pulmonary:     Effort: Pulmonary effort is normal.  Musculoskeletal:     Comments: The left ankle has swelling and tenderness over the lateral malleolus and extending into the ankle joint.  There is no deformity.  DP pulses are palpable and motor and sensation are intact throughout the entire foot.  She has pain with range of motion which limits exam somewhat, but the ankle appears stable.  Skin:    General: Skin is warm and dry.  Neurological:     Mental Status: She is alert and oriented to person, place, and time.     ED Results /  Procedures / Treatments   Labs (all labs ordered are listed, but only abnormal results are displayed) Labs Reviewed - No data to display  EKG None  Radiology DG Ankle Complete Left Result Date: 05/23/2023 CLINICAL DATA:  Status post trauma. EXAM: LEFT ANKLE COMPLETE - 3+ VIEW COMPARISON:  None Available. FINDINGS: There is no evidence of fracture, dislocation, or joint effusion. There is no evidence of arthropathy or other focal bone abnormality. Soft tissues are unremarkable. IMPRESSION: Negative. Electronically Signed   By: Aram Candela M.D.   On: 05/23/2023 21:53    Procedures Procedures    Medications Ordered in ED Medications - No data to display  ED Course/ Medical Decision Making/ A&P  X-rays negative for fracture.  This to be treated as a sprain.  Final Clinical Impression(s) / ED Diagnoses Final diagnoses:  None    Rx / DC Orders ED Discharge Orders     None         Geoffery Lyons, MD 05/24/23 [phone]

## 2023-05-24 NOTE — Discharge Instructions (Signed)
 Wear Ace bandage for comfort and support.  Ice for 20 minutes every 2 hours while awake for the next 2 days.  Elevate your leg is much as possible for the next 2 days.  Weightbearing as tolerated.  Follow-up with primary doctor if symptoms are not improving in the next 1 to 2 weeks.

## 2023-10-13 ENCOUNTER — Ambulatory Visit
Admission: EM | Admit: 2023-10-13 | Discharge: 2023-10-13 | Disposition: A | Discharge status: Home / Self Care | Attending: Family Medicine | Admitting: Family Medicine

## 2023-10-13 DIAGNOSIS — J209 Acute bronchitis, unspecified: Secondary | ICD-10-CM

## 2023-10-13 DIAGNOSIS — H66002 Acute suppurative otitis media without spontaneous rupture of ear drum, left ear: Secondary | ICD-10-CM | POA: Diagnosis not present

## 2023-10-13 MED ORDER — AMOXICILLIN-POT CLAVULANATE 875-125 MG PO TABS: 1.0000 | ORAL_TABLET | Freq: Two times a day (BID) | ORAL | 0 refills | Status: AC

## 2023-10-13 MED ORDER — PROMETHAZINE-DM 6.25-15 MG/5ML PO SYRP: 5.0000 mL | ORAL_SOLUTION | Freq: Four times a day (QID) | ORAL | 0 refills | Status: DC | PRN

## 2023-10-13 MED ORDER — FLUTICASONE PROPIONATE 50 MCG/ACT NA SUSP: 1.0000 | Freq: Every day | NASAL | 0 refills | Status: DC

## 2023-10-13 NOTE — ED Provider Notes (Signed)
 [facility]    CSN: [id number] Arrival date & time: 10/13/23  1545      History   Chief Complaint Chief Complaint  Patient presents with   Sore Throat   Ear Pain    HPI Julia Bridges is a 33 y.o. female  presents for evaluation of URI symptoms for 7-8 days. Patient reports associated symptoms of cough, congestion, ear pain, sore throat. Denies N/V/D, fevers, body aches, shortness of breath. Patient does not have a hx of asthma. Patient is not an active smoker.   Reports mother has similar symptoms.  Pt has taken cough drops OTC for symptoms. Pt has no other concerns at this time.    Sore Throat    Past Medical History:  Diagnosis Date   Allergy     There are no active problems to display for this patient.   History reviewed. No pertinent surgical history.  OB History   No obstetric history on file.      Home Medications    Prior to Admission medications   Medication Sig Start Date End Date Taking? Authorizing Provider  amoxicillin -clavulanate (AUGMENTIN ) 875-125 MG tablet Take 1 tablet by mouth every 12 (twelve) hours for 10 days. 10/13/23 10/23/23 Yes Vir Whetstine, Jodi R, NP  fluticasone  (FLONASE ) 50 MCG/ACT nasal spray Place 1 spray into both nostrils daily. 10/13/23  Yes Laniya Friedl, Jodi R, NP  promethazine -dextromethorphan (PROMETHAZINE -DM) 6.25-15 MG/5ML syrup Take 5 mLs by mouth 4 (four) times daily as needed for cough. 10/13/23  Yes Ellijah Leffel, Jodi R, NP  cetirizine  (ZYRTEC  ALLERGY) 10 MG tablet Take 1 tablet (10 mg total) by mouth daily. Patient not taking: Reported on 05/16/2023 04/18/23   Loreda Myla SAUNDERS, NP    Family History Family History  Problem Relation Age of Onset   Hypertension Mother    Hyperlipidemia Mother    Diabetes Mother    Arthritis Mother    Hypertension Father    Hyperlipidemia Father    Arthritis Father    Hypertension Brother    Arthritis Maternal Grandmother    Arthritis Maternal Grandfather    Arthritis Paternal  Grandmother    Arthritis Paternal Grandfather     Social History Social History   Tobacco Use   Smoking status: Never   Smokeless tobacco: Never  Vaping Use   Vaping status: Never Used  Substance Use Topics   Alcohol use: No   Drug use: No     Allergies   Patient has no known allergies.   Review of Systems Review of Systems  HENT:  Positive for congestion, ear pain and sore throat.   Respiratory:  Positive for cough.      Physical Exam Triage Vital Signs ED Triage Vitals [10/13/23 1724]  Encounter Vitals Group     BP 127/83     Girls Systolic BP Percentile      Girls Diastolic BP Percentile      Boys Systolic BP Percentile      Boys Diastolic BP Percentile      Pulse Rate 94     Resp 17     Temp 98.3 F (36.8 C)     Temp Source Oral     SpO2 94 %     Weight      Height      Head Circumference      Peak Flow      Pain Score 5     Pain Loc      Pain Education  Exclude from Growth Chart    No data found.  Updated Vital Signs BP 127/83 (BP Location: Left Arm)   Pulse 94   Temp 98.3 F (36.8 C) (Oral)   Resp 17   LMP  (LMP Unknown)   SpO2 94%   Visual Acuity Right Eye Distance:   Left Eye Distance:   Bilateral Distance:    Right Eye Near:   Left Eye Near:    Bilateral Near:     Physical Exam Vitals and nursing note reviewed.  Constitutional:      General: She is not in acute distress.    Appearance: She is well-developed. She is not ill-appearing.  HENT:     Head: Normocephalic and atraumatic.     Right Ear: Tympanic membrane and ear canal normal.     Left Ear: Ear canal normal. Tympanic membrane is erythematous.     Nose: Congestion present.     Mouth/Throat:     Mouth: Mucous membranes are moist.     Pharynx: Oropharynx is clear. Uvula midline. No oropharyngeal exudate or posterior oropharyngeal erythema.     Tonsils: No tonsillar exudate or tonsillar abscesses.  Eyes:     Conjunctiva/sclera: Conjunctivae normal.     Pupils:  Pupils are equal, round, and reactive to light.  Cardiovascular:     Rate and Rhythm: Normal rate and regular rhythm.     Heart sounds: Normal heart sounds.  Pulmonary:     Effort: Pulmonary effort is normal.     Breath sounds: Normal breath sounds. No wheezing or rhonchi.  Musculoskeletal:     Cervical back: Normal range of motion and neck supple.  Lymphadenopathy:     Cervical: No cervical adenopathy.  Skin:    General: Skin is warm and dry.  Neurological:     General: No focal deficit present.     Mental Status: She is alert and oriented to person, place, and time.  Psychiatric:        Mood and Affect: Mood normal.        Behavior: Behavior normal.      UC Treatments / Results  Labs (all labs ordered are listed, but only abnormal results are displayed) Labs Reviewed - No data to display  EKG   Radiology No results found.  Procedures Procedures (including critical care time)  Medications Ordered in UC Medications - No data to display  Initial Impression / Assessment and Plan / UC Course  I have reviewed the triage vital signs and the nursing notes.  Pertinent labs & imaging results that were available during my care of the patient were reviewed by me and considered in my medical decision making (see chart for details).     Reviewed exam and with patient.  No red flags.  Start Augmentin  for bronchitis/left.  Flonase  daily.  Promethazine  DM as needed for cough, side effect profile reviewed.  Advise rest fluids and PCP follow-up if symptoms do not improve.  ER precautions reviewed. Final Clinical Impressions(s) / UC Diagnoses   Final diagnoses:  Acute suppurative otitis media of left ear without spontaneous rupture of tympanic membrane, recurrence not specified  Acute bronchitis, unspecified organism     Discharge Instructions      Start Augmentin  twice daily for 10 days.  Promethazine  DM as needed for your cough.  Please note this medication will make you  drowsy.  Do not drink alcohol or drive while on this medication.  Use Flonase  daily.  Lots of rest and fluids.  Please follow-up with your PCP if your symptoms do not improve.  Please go to the ER if you develop any worsening symptoms.  Hope you feel better soon!    ED Prescriptions     Medication Sig Dispense Auth. Provider   amoxicillin -clavulanate (AUGMENTIN ) 875-125 MG tablet Take 1 tablet by mouth every 12 (twelve) hours for 10 days. 20 tablet Jacari Iannello, Jodi R, NP   promethazine -dextromethorphan (PROMETHAZINE -DM) 6.25-15 MG/5ML syrup Take 5 mLs by mouth 4 (four) times daily as needed for cough. 118 mL Lavona Norsworthy, Jodi R, NP   fluticasone  (FLONASE ) 50 MCG/ACT nasal spray Place 1 spray into both nostrils daily. 15.8 mL Farhana Fellows, Jodi R, NP      PDMP not reviewed this encounter.   Loreda Myla SAUNDERS, NP 10/13/23 [phone]

## 2023-10-13 NOTE — ED Triage Notes (Signed)
 Pt present with a cough x one week. Pt states she has a sore throat. Reports months ago she has fluid in her ear, it has gone away and now has come back. Pt reports she has rt ear pressure and discomfort.  Home interventions: cough drops

## 2023-10-13 NOTE — Discharge Instructions (Addendum)
 Start Augmentin  twice daily for 10 days.  Promethazine  DM as needed for your cough.  Please note this medication will make you drowsy.  Do not drink alcohol or drive while on this medication.  Use Flonase  daily.  Lots of rest and fluids.  Please follow-up with your PCP if your symptoms do not improve.  Please go to the ER if you develop any worsening symptoms.  Hope you feel better soon!

## 2023-10-20 ENCOUNTER — Encounter: Payer: Self-pay | Admitting: Family Medicine

## 2023-10-20 ENCOUNTER — Ambulatory Visit (INDEPENDENT_AMBULATORY_CARE_PROVIDER_SITE_OTHER): Admitting: Family Medicine

## 2023-10-20 VITALS — BP 110/72 | HR 73 | Temp 97.5°F | Ht 68.0 in | Wt 192.0 lb

## 2023-10-20 DIAGNOSIS — Z124 Encounter for screening for malignant neoplasm of cervix: Secondary | ICD-10-CM

## 2023-10-20 DIAGNOSIS — K5904 Chronic idiopathic constipation: Secondary | ICD-10-CM | POA: Diagnosis not present

## 2023-10-20 MED ORDER — BENEFIBER PREBIOTIC FIBER PO CHEW: 2.0000 | CHEWABLE_TABLET | Freq: Every day | ORAL | 3 refills | Status: AC

## 2023-10-20 NOTE — Patient Instructions (Addendum)
 It was very nice to see you today!  Benefiber daily.   Kiwi fruit   PLEASE NOTE:  If you had any lab tests please let us  know if you have not heard back within a few days. You may see your results on MyChart before we have a chance to review them but we will give you a call once they are reviewed by us . If we ordered any referrals today, please let us  know if you have not heard from their office within the next week.   Please try these tips to maintain a healthy lifestyle:  Eat most of your calories during the day when you are active. Eliminate processed foods including packaged sweets (pies, cakes, cookies), reduce intake of potatoes, white bread, white pasta, and white rice. Look for whole grain options, oat flour or almond flour.  Each meal should contain half fruits/vegetables, one quarter protein, and one quarter carbs (no bigger than a computer mouse).  Cut down on sweet beverages. This includes juice, soda, and sweet tea. Also watch fruit intake, though this is a healthier sweet option, it still contains natural sugar! Limit to 3 servings daily.  Drink at least 1 glass of water with each meal and aim for at least 8 glasses per day  Exercise at least 150 minutes every week.

## 2023-10-20 NOTE — Progress Notes (Signed)
 Subjective:     Patient ID: Julia Bridges, female    DOB: 03-Sep-1990, 33 y.o.   MRN: [id number]  Chief Complaint  Patient presents with   Constipation    Has been going on for long time she has to take medication help with bowel movements making her feeling really tired from all of it .    HPI Discussed the use of AI scribe software for clinical note transcription with the patient, who gave verbal consent to proceed.  History of Present Illness Julia Bridges is a 33 year old female who presents with fatigue and stomach issues.  She has a history of chronic constipation, with bowel movements occurring as infrequently as once every seven to ten days. She uses a product she refers to as 'Sinemetica', believed to contain Sena, which she finds effective, and has tried various over-the-counter medications. There is no associated pain, but she experiences significant fatigue and a feeling of being 'drained'. Her constipation tends to improve during her menstrual periods, allowing for more frequent bowel movements.  She is currently drinking plenty of water and has increased her physical activity. She avoids bananas, which she knows can be constipating, but consumes kiwi and makes fruit salads to help with her condition.  She was previously prescribed Augmentin  and a cough syrup for a middle ear infection. She took Augmentin  for three days but did not complete the course as she felt better. She denies experiencing diarrhea and instead reports ongoing constipation. She did not experience any ear drainage .  Her family history is significant for diabetes. Her last blood work in March showed an A1c at the top of the normal range (5.6).    Health Maintenance Due  Topic Date Due   Pneumococcal Vaccine (1 of 2 - PCV) Never done   HPV VACCINES (1 - 3-dose SCDM series) Never done   Cervical Cancer Screening (HPV/Pap Cotest)  Never done   INFLUENZA VACCINE  09/26/2023    Past  Medical History:  Diagnosis Date   Allergy     History reviewed. No pertinent surgical history.   Current Outpatient Medications:    Inulin-Corn Fiber (BENEFIBER PREBIOTIC FIBER) CHEW, Chew 2 tablets by mouth daily in the afternoon., Disp: 200 tablet, Rfl: 3   amoxicillin -clavulanate (AUGMENTIN ) 875-125 MG tablet, Take 1 tablet by mouth every 12 (twelve) hours for 10 days. (Patient not taking: Reported on 10/20/2023), Disp: 20 tablet, Rfl: 0  No Known Allergies ROS neg/noncontributory except as noted HPI/below      Objective:     BP 110/72   Pulse 73   Temp (!) 97.5 F (36.4 C) (Temporal)   Ht 5' 8 (1.727 m)   Wt 192 lb (87.1 kg)   LMP  (LMP Unknown)   SpO2 98%   BMI 29.19 kg/m  Wt Readings from Last 3 Encounters:  10/20/23 192 lb (87.1 kg)  05/23/23 186 lb 8 oz (84.6 kg)  05/16/23 186 lb 8 oz (84.6 kg)    Physical Exam   Gen: WDWN NAD HEENT: NCAT, conjunctiva not injected, sclera nonicteric TM WNL B  NECK:  supple, no thyromegaly, no nodes, no carotid bruits ABDOMEN:  BS+, soft, NTND, No HSM, no masses EXT:  no edema MSK: no gross abnormalities.  NEURO: A&O x3.  CN II-XII intact.  PSYCH: normal mood. Good eye contact     Assessment & Plan:  Chronic idiopathic constipation  Screening for cervical cancer -     Ambulatory referral to Obstetrics /  Gynecology  Other orders -     Benefiber Prebiotic Fiber; Chew 2 tablets by mouth daily in the afternoon.  Dispense: 200 tablet; Refill: 3  Assessment and Plan Assessment & Plan Chronic constipation   Chronic constipation results in infrequent bowel movements every 7 to 10 days, causing significant fatigue and malaise. Symptoms improve during menstrual periods. Current management with over-the-counter medications and Senokot is effective. No pain or cramping is present, so  will hold on prescription medication. Recommend daily use of Benefiber to increase fiber intake. Advise monitoring for any signs of blood in  stool. Encourage continued hydration and regular exercise. Discuss potential dietary modifications, such as reducing starch intake and incorporating kiwi for its beneficial effects on constipation.    Return in about 7 months (around 05/19/2024) for annual physical.  Jenkins CHRISTELLA Carrel, MD

## 2023-11-25 ENCOUNTER — Ambulatory Visit (INDEPENDENT_AMBULATORY_CARE_PROVIDER_SITE_OTHER): Admitting: Family Medicine

## 2023-11-25 VITALS — BP 120/78 | HR 84 | Temp 97.9°F | Ht 68.0 in | Wt 196.0 lb

## 2023-11-25 DIAGNOSIS — R21 Rash and other nonspecific skin eruption: Secondary | ICD-10-CM

## 2023-11-25 MED ORDER — HYDROQUINONE 4 % EX CREA: TOPICAL_CREAM | Freq: Two times a day (BID) | CUTANEOUS | 1 refills | Status: AC

## 2023-11-25 MED ORDER — TRIAMCINOLONE ACETONIDE 0.1 % EX CREA: 1.0000 | TOPICAL_CREAM | Freq: Two times a day (BID) | CUTANEOUS | 1 refills | Status: AC

## 2023-11-25 NOTE — Progress Notes (Signed)
 Subjective:     Patient ID: Julia Bridges, female    DOB: 28-Oct-1990, 33 y.o.   MRN: [id number]  Chief Complaint  Patient presents with   Rash    Skin patch on L leg, dry, hard and very itchy, getting worse; been there for a while; tried OTC stuff and Vaseline but not helping    HPI Discussed the use of AI scribe software for clinical note transcription with the patient, who gave verbal consent to proceed.  History of Present Illness Julia Bridges is a 33 year old female who presents with a persistent itchy skin patch on her right leg.  She has had a skin patch on her right leg for over six months. The patch is itchy and hard, and it has been progressively worsening. No prior history of similar skin issues, such as eczema, and no injuries to the area.  She has attempted to manage the symptoms with Vaseline and over-the-counter products, including hydrocortisone cream, but has not noticed any significant improvement or worsening with these treatments.  Additionally, she mentions discoloration of her inner thighs and has observed that the discoloration is worsening. She has tried various soaps and products based on her research but has observed that the discoloration is worsening. Would like to try hydroquinone cream  No history of eczema or similar skin conditions in childhood.    Health Maintenance Due  Topic Date Due   Pneumococcal Vaccine (1 of 2 - PCV) Never done   HPV VACCINES (1 - 3-dose SCDM series) Never done   Cervical Cancer Screening (HPV/Pap Cotest)  Never done   COVID-19 Vaccine (1 - 2024-25 season) Never done    Past Medical History:  Diagnosis Date   Allergy     History reviewed. No pertinent surgical history.   Current Outpatient Medications:    hydroquinone 4 % cream, Apply topically 2 (two) times daily., Disp: 28.35 g, Rfl: 1   Inulin-Corn Fiber (BENEFIBER PREBIOTIC FIBER) CHEW, Chew 2 tablets by mouth daily in the afternoon., Disp:  200 tablet, Rfl: 3   triamcinolone cream (KENALOG) 0.1 %, Apply 1 Application topically 2 (two) times daily., Disp: 30 g, Rfl: 1  No Known Allergies ROS neg/noncontributory except as noted HPI/below      Objective:     BP 120/78   Pulse 84   Temp 97.9 F (36.6 C)   Ht 5' 8 (1.727 m)   Wt 196 lb (88.9 kg)   SpO2 98%   BMI 29.80 kg/m  Wt Readings from Last 3 Encounters:  11/25/23 196 lb (88.9 kg)  10/20/23 192 lb (87.1 kg)  05/23/23 186 lb 8 oz (84.6 kg)    Physical Exam   Gen: WDWN NAD HEENT: NCAT, conjunctiva not injected, sclera nonicteric EXT:  no edema MSK: no gross abnormalities.  NEURO: A&O x3.  CN II-XII intact.  PSYCH: normal mood. Good eye contact  R lateral lower leg  Also, inner, upper thighs more central/rear-hyperpigmentation    Assessment & Plan:  Rash -     Ambulatory referral to Dermatology  Other orders -     Triamcinolone Acetonide; Apply 1 Application topically 2 (two) times daily.  Dispense: 30 g; Refill: 1 -     Hydroquinone; Apply topically 2 (two) times daily.  Dispense: 28.35 g; Refill: 1  Assessment and Plan Assessment & Plan Chronic pruritic skin lesion, right leg   The lesion on her right leg has persisted for over six months, is pruritic, and unresponsive  to over-the-counter treatments like hydrocortisone. It is likely eczema, with a less probable fungal infection, or other. The lesion appears darker due to her skin color. Prescribe a stronger steroid cream and advise continued moisturizing with products like Aquaphor or Vaseline. Take a picture of the lesion for monitoring. Refer to dermatology for further evaluation, with the option to cancel if the condition resolves with current treatment. Instruct her to report back in 3-4 weeks or sooner if the condition worsens. Advise against long-term daily use of steroid cream to prevent skin thinning.  Hyperpigmentation, inner thighs   Hyperpigmentation on her inner thighs is likely due to  friction from thighs rubbing together. Previous self-treatment with soaps and other products was unsuccessful. Prescribe hydroquinone cream for treatment, advising careful application to avoid depigmenting surrounding skin. Consider dermatology referral for further evaluation.    Return if symptoms worsen or fail to improve.  Jenkins CHRISTELLA Carrel, MD

## 2023-11-25 NOTE — Patient Instructions (Addendum)
 [facility] [address] [place] [address] [phone]  Referral derm  Triamcinolone goes on lower leg

## 2023-12-18 ENCOUNTER — Telehealth: Payer: Self-pay

## 2023-12-18 DIAGNOSIS — E663 Overweight: Secondary | ICD-10-CM

## 2023-12-18 DIAGNOSIS — Z833 Family history of diabetes mellitus: Secondary | ICD-10-CM

## 2023-12-18 NOTE — Telephone Encounter (Signed)
 Spoke with patient, pt asking for referral to [facility] in [place]. Sent to Dr Wendolyn for Review.

## 2023-12-18 NOTE — Telephone Encounter (Signed)
 Referral for [facility] in [place] placed per Dr Wendolyn.

## 2023-12-18 NOTE — Addendum Note (Signed)
 Addended by: Buddy Loeffelholz on: 12/18/2023 12:39 PM   Modules accepted: Orders

## 2023-12-31 DIAGNOSIS — K029 Dental caries, unspecified: Secondary | ICD-10-CM | POA: Diagnosis not present

## 2024-01-06 DIAGNOSIS — E66811 Obesity, class 1: Secondary | ICD-10-CM | POA: Diagnosis not present

## 2024-01-06 DIAGNOSIS — Z6830 Body mass index (BMI) 30.0-30.9, adult: Secondary | ICD-10-CM | POA: Diagnosis not present

## 2024-01-06 DIAGNOSIS — Z7182 Exercise counseling: Secondary | ICD-10-CM | POA: Diagnosis not present

## 2024-01-06 DIAGNOSIS — Z713 Dietary counseling and surveillance: Secondary | ICD-10-CM | POA: Diagnosis not present

## 2024-01-06 DIAGNOSIS — Z1389 Encounter for screening for other disorder: Secondary | ICD-10-CM | POA: Diagnosis not present

## 2024-01-06 DIAGNOSIS — Z1331 Encounter for screening for depression: Secondary | ICD-10-CM | POA: Diagnosis not present

## 2024-01-07 DIAGNOSIS — Z1389 Encounter for screening for other disorder: Secondary | ICD-10-CM | POA: Diagnosis not present

## 2024-01-07 DIAGNOSIS — Z6830 Body mass index (BMI) 30.0-30.9, adult: Secondary | ICD-10-CM | POA: Diagnosis not present

## 2024-01-07 DIAGNOSIS — E66811 Obesity, class 1: Secondary | ICD-10-CM | POA: Diagnosis not present

## 2024-02-02 DIAGNOSIS — R059 Cough, unspecified: Secondary | ICD-10-CM | POA: Diagnosis not present

## 2024-02-02 DIAGNOSIS — J209 Acute bronchitis, unspecified: Secondary | ICD-10-CM | POA: Diagnosis not present

## 2024-02-02 DIAGNOSIS — Z20822 Contact with and (suspected) exposure to covid-19: Secondary | ICD-10-CM | POA: Diagnosis not present

## 2024-06-29 ENCOUNTER — Ambulatory Visit: Admitting: Physician Assistant
